# Patient Record
Sex: Male | Born: 1995 | Race: Black or African American | Hispanic: No | Marital: Single
Health system: Southern US, Community
[De-identification: ages and names within clinical notes are randomized; demographics above are authoritative.]

## PROBLEM LIST (undated history)

## (undated) DIAGNOSIS — K649 Unspecified hemorrhoids: Secondary | ICD-10-CM

## (undated) DIAGNOSIS — F419 Anxiety disorder, unspecified: Secondary | ICD-10-CM

## (undated) DIAGNOSIS — F32A Depression, unspecified: Secondary | ICD-10-CM

## (undated) DIAGNOSIS — K602 Anal fissure, unspecified: Secondary | ICD-10-CM

---
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
## 2017-09-11 ENCOUNTER — Emergency Department (HOSPITAL_COMMUNITY)
Admission: EM | Admit: 2017-09-11 | Discharge: 2017-09-11 | Disposition: A | Discharge status: Home / Self Care | Payer: No Typology Code available for payment source | Attending: Emergency Medicine | Admitting: Emergency Medicine

## 2017-09-11 ENCOUNTER — Encounter (HOSPITAL_COMMUNITY): Payer: Self-pay | Admitting: Emergency Medicine

## 2017-09-11 ENCOUNTER — Emergency Department (HOSPITAL_COMMUNITY): Payer: No Typology Code available for payment source

## 2017-09-11 DIAGNOSIS — S90811A Abrasion, right foot, initial encounter: Secondary | ICD-10-CM | POA: Diagnosis not present

## 2017-09-11 DIAGNOSIS — Y92410 Unspecified street and highway as the place of occurrence of the external cause: Secondary | ICD-10-CM | POA: Insufficient documentation

## 2017-09-11 DIAGNOSIS — Y999 Unspecified external cause status: Secondary | ICD-10-CM | POA: Insufficient documentation

## 2017-09-11 DIAGNOSIS — Y9301 Activity, walking, marching and hiking: Secondary | ICD-10-CM | POA: Insufficient documentation

## 2017-09-11 DIAGNOSIS — S8991XA Unspecified injury of right lower leg, initial encounter: Secondary | ICD-10-CM | POA: Diagnosis present

## 2017-09-11 DIAGNOSIS — S9031XA Contusion of right foot, initial encounter: Secondary | ICD-10-CM | POA: Insufficient documentation

## 2017-09-11 DIAGNOSIS — S7001XA Contusion of right hip, initial encounter: Secondary | ICD-10-CM | POA: Insufficient documentation

## 2017-09-11 MED ORDER — BACITRACIN ZINC 500 UNIT/GM EX OINT
TOPICAL_OINTMENT | Freq: Every day | CUTANEOUS | Status: DC
  Administered 2017-09-11: 2 via TOPICAL
  Filled 2017-09-11: qty 1.8

## 2017-09-11 MED ORDER — HYDROCODONE-ACETAMINOPHEN 5-325 MG PO TABS
1.0000 | ORAL_TABLET | Freq: Once | ORAL | Status: AC
  Administered 2017-09-11: 1 via ORAL
  Filled 2017-09-11: qty 1

## 2017-09-11 NOTE — Discharge Instructions (Addendum)
Wear CAM walker and use crutches as needed for comfort. Ice and elevate areas of pain throughout the day, using ice pack for no more than 20 minutes every hour.  Alternate between tylenol and motrin as needed for pain relief.  Keep wounds clean with mild soap and water. Keep area covered with a topical antibiotic ointment and bandage, keep bandage dry. Monitor wounds for signs of infection to include, but not limited to: increasing pain, spreading redness, drainage/pus, worsening swelling, or fevers. Call orthopedic follow up today or tomorrow to schedule followup appointment for recheck of symptoms in 1 week. Return to the ER for changes or worsening symptoms.
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
## 2017-09-12 ENCOUNTER — Encounter (HOSPITAL_COMMUNITY): Payer: Self-pay | Admitting: Emergency Medicine

## 2019-01-18 ENCOUNTER — Other Ambulatory Visit: Payer: Self-pay

## 2019-01-18 DIAGNOSIS — Z20822 Contact with and (suspected) exposure to covid-19: Secondary | ICD-10-CM

## 2019-01-19 LAB — NOVEL CORONAVIRUS, NAA: SARS-CoV-2, NAA: NOT DETECTED

## 2019-08-12 ENCOUNTER — Other Ambulatory Visit: Payer: Self-pay

## 2019-08-12 ENCOUNTER — Ambulatory Visit (HOSPITAL_COMMUNITY)
Admission: EM | Admit: 2019-08-12 | Discharge: 2019-08-12 | Disposition: A | Discharge status: Home / Self Care | Payer: Self-pay

## 2019-08-12 NOTE — ED Notes (Signed)
Per Judeth Cornfield, pt was sent to the Emergency room for further evaluation.  Pt C/O blood in stool and passing out 20 minutes ago. So stephanie thought it would be in the patient best interest to go to the ER.

## 2019-08-24 ENCOUNTER — Emergency Department (HOSPITAL_COMMUNITY): Admission: EM | Admit: 2019-08-24 | Payer: No Typology Code available for payment source | Source: Home / Self Care

## 2019-08-24 ENCOUNTER — Other Ambulatory Visit: Payer: Self-pay

## 2020-11-26 ENCOUNTER — Encounter (HOSPITAL_COMMUNITY): Payer: Self-pay | Admitting: Emergency Medicine

## 2020-11-26 ENCOUNTER — Other Ambulatory Visit: Payer: Self-pay

## 2020-11-26 ENCOUNTER — Emergency Department (HOSPITAL_COMMUNITY): Payer: No Typology Code available for payment source

## 2020-11-26 ENCOUNTER — Emergency Department (HOSPITAL_COMMUNITY)
Admission: EM | Admit: 2020-11-26 | Discharge: 2020-11-26 | Disposition: A | Discharge status: Home / Self Care | Payer: No Typology Code available for payment source | Attending: Emergency Medicine | Admitting: Emergency Medicine

## 2020-11-26 DIAGNOSIS — Y92410 Unspecified street and highway as the place of occurrence of the external cause: Secondary | ICD-10-CM | POA: Insufficient documentation

## 2020-11-26 DIAGNOSIS — Z23 Encounter for immunization: Secondary | ICD-10-CM | POA: Insufficient documentation

## 2020-11-26 DIAGNOSIS — R55 Syncope and collapse: Secondary | ICD-10-CM | POA: Insufficient documentation

## 2020-11-26 DIAGNOSIS — R42 Dizziness and giddiness: Secondary | ICD-10-CM | POA: Diagnosis not present

## 2020-11-26 DIAGNOSIS — H538 Other visual disturbances: Secondary | ICD-10-CM | POA: Insufficient documentation

## 2020-11-26 DIAGNOSIS — R519 Headache, unspecified: Secondary | ICD-10-CM | POA: Diagnosis not present

## 2020-11-26 DIAGNOSIS — S60511A Abrasion of right hand, initial encounter: Secondary | ICD-10-CM | POA: Diagnosis not present

## 2020-11-26 DIAGNOSIS — S6991XA Unspecified injury of right wrist, hand and finger(s), initial encounter: Secondary | ICD-10-CM | POA: Diagnosis present

## 2020-11-26 LAB — COMPREHENSIVE METABOLIC PANEL
ALT: 12 U/L (ref 0–44)
AST: 22 U/L (ref 15–41)
Albumin: 4.4 g/dL (ref 3.5–5.0)
Alkaline Phosphatase: 78 U/L (ref 38–126)
Anion gap: 7 (ref 5–15)
BUN: 12 mg/dL (ref 6–20)
CO2: 28 mmol/L (ref 22–32)
Calcium: 9.4 mg/dL (ref 8.9–10.3)
Chloride: 105 mmol/L (ref 98–111)
Creatinine, Ser: 1.14 mg/dL (ref 0.61–1.24)
GFR, Estimated: >60 mL/min (ref >60)
Glucose, Bld: 94 mg/dL (ref 70–99)
Potassium: 4.4 mmol/L (ref 3.5–5.1)
Sodium: 140 mmol/L (ref 135–145)
Total Bilirubin: 0.6 mg/dL (ref 0.3–1.2)
Total Protein: 8.2 g/dL — ABNORMAL HIGH (ref 6.5–8.1)

## 2020-11-26 LAB — CBC WITH DIFFERENTIAL/PLATELET
Abs Immature Granulocytes: 0.01 10*3/uL (ref 0.00–0.07)
Basophils Absolute: 0 10*3/uL (ref 0.0–0.1)
Basophils Relative: 1 %
Eosinophils Absolute: 0.1 10*3/uL (ref 0.0–0.5)
Eosinophils Relative: 3 %
HCT: 42.2 % (ref 39.0–52.0)
Hemoglobin: 14 g/dL (ref 13.0–17.0)
Immature Granulocytes: 0 %
Lymphocytes Relative: 36 %
Lymphs Abs: 1.5 10*3/uL (ref 0.7–4.0)
MCH: 29.5 pg (ref 26.0–34.0)
MCHC: 33.2 g/dL (ref 30.0–36.0)
MCV: 89 fL (ref 80.0–100.0)
Monocytes Absolute: 0.4 10*3/uL (ref 0.1–1.0)
Monocytes Relative: 10 %
Neutro Abs: 2.1 10*3/uL (ref 1.7–7.7)
Neutrophils Relative %: 50 %
Platelets: 230 10*3/uL (ref 150–400)
RBC: 4.74 MIL/uL (ref 4.22–5.81)
RDW: 12.3 % (ref 11.5–15.5)
WBC: 4.2 10*3/uL (ref 4.0–10.5)
nRBC: 0 % (ref 0.0–0.2)
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
## 2020-11-26 MED ORDER — TETANUS-DIPHTH-ACELL PERTUSSIS 5-2.5-18.5 LF-MCG/0.5 IM SUSY
0.5000 mL | PREFILLED_SYRINGE | Freq: Once | INTRAMUSCULAR | Status: AC
  Administered 2020-11-26: 0.5 mL via INTRAMUSCULAR
  Filled 2020-11-26: qty 0.5

## 2020-11-26 NOTE — Discharge Instructions (Signed)
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  Please continue to monitor your symptoms closely.  I would also recommend following up with your regular doctor.  If you develop any new or worsening symptoms please come back to the emergency department.  It was a pleasure to meet you.
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
## 2020-12-14 ENCOUNTER — Other Ambulatory Visit: Payer: Self-pay

## 2020-12-14 ENCOUNTER — Emergency Department (HOSPITAL_COMMUNITY)
Admission: EM | Admit: 2020-12-14 | Discharge: 2020-12-15 | Disposition: A | Discharge status: Home / Self Care | Payer: Self-pay | Attending: Emergency Medicine | Admitting: Emergency Medicine

## 2020-12-14 ENCOUNTER — Encounter (HOSPITAL_COMMUNITY): Payer: Self-pay | Admitting: Emergency Medicine

## 2020-12-14 DIAGNOSIS — K644 Residual hemorrhoidal skin tags: Secondary | ICD-10-CM | POA: Insufficient documentation

## 2020-12-14 NOTE — ED Triage Notes (Signed)
Pt c/o hemorrhoids x 1 months not relieved by OTC meds.

## 2020-12-15 MED ORDER — HYDROCORTISONE (PERIANAL) 2.5 % EX CREA
TOPICAL_CREAM | Freq: Two times a day (BID) | CUTANEOUS | Status: DC
  Administered 2020-12-15: 1 via RECTAL
  Filled 2020-12-15: qty 28.35

## 2020-12-15 MED ORDER — LIDOCAINE HCL URETHRAL/MUCOSAL 2 % EX GEL
1.0000 "application " | Freq: Once | CUTANEOUS | Status: AC
  Administered 2020-12-15: 1 via TOPICAL
  Filled 2020-12-15: qty 11

## 2020-12-15 NOTE — Discharge Instructions (Addendum)
Apply topical Anusol HC rectal cream twice a day. Continue Miralax and stool softeners. You may benefit from sitz baths 2 times per day as well. Follow up with general surgery in clinic.

## 2020-12-15 NOTE — ED Provider Notes (Signed)
[facility]-EMERGENCY DEPT Provider Note   CSN: [id number] Arrival date & time: 12/14/20  2326     History Chief Complaint  Patient presents with   Hemorrhoids   Rectal Pain    Jim Hall is a 25 y.o. male.  25 year old male presents to the emergency department for evaluation of a hemorrhoid x1 month.  Hemorrhoid has been painful which is noted to be constant, worsening.  No relief with over-the-counter Preparation H cream.  Has also tried stool softeners.  Continues to have bowel movements without issue, though this does increase discomfort.  No associated fevers.  The history is provided by the patient. No language interpreter was used.      History reviewed. No pertinent past medical history.  There are no problems to display for this patient.   History reviewed. No pertinent surgical history.     No family history on file.  Social History   Tobacco Use   Smoking status: Never   Smokeless tobacco: Never  Substance Use Topics   Alcohol use: Never   Drug use: Never    Home Medications Prior to Admission medications   Medication Sig Start Date End Date Taking? Authorizing Provider  ibuprofen (ADVIL,MOTRIN) 200 MG tablet Take 400 mg by mouth every 6 (six) hours as needed for moderate pain.    [provider]  omeprazole (PRILOSEC) 20 MG capsule Take 1 capsule (20 mg total) by mouth daily. 02/18/14   Elpidio Anis, PA-C    Allergies    Patient has no known allergies.  Review of Systems   Review of Systems Ten systems reviewed and are negative for acute change, except as noted in the HPI.    Physical Exam Updated Vital Signs BP 110/72 (BP Location: Left Arm)   Pulse 84   Temp 98 F (36.7 C) (Oral)   Resp 16   SpO2 100%   Physical Exam Vitals and nursing note reviewed. Exam conducted with a chaperone present.  Constitutional:      General: He is not in acute distress.    Appearance: He is well-developed. He is not  diaphoretic.     Comments: Nontoxic appearing  HENT:     Head: Normocephalic and atraumatic.  Eyes:     General: No scleral icterus.    Conjunctiva/sclera: Conjunctivae normal.  Pulmonary:     Effort: Pulmonary effort is normal. No respiratory distress.  Genitourinary:    Rectum: External hemorrhoid present.       Comments: Exam chaperoned by RN. 1 external, nonthrombosed, hemorrhoid noted. Associated TTP. No bleeding. Musculoskeletal:        General: Normal range of motion.     Cervical back: Normal range of motion.  Skin:    General: Skin is warm and dry.     Coloration: Skin is not pale.     Findings: No erythema or rash.  Neurological:     Mental Status: He is alert and oriented to person, place, and time.  Psychiatric:        Behavior: Behavior normal.    ED Results / Procedures / Treatments   Labs (all labs ordered are listed, but only abnormal results are displayed) Labs Reviewed - No data to display  EKG None  Radiology No results found.  Procedures Procedures   Medications Ordered in ED Medications  hydrocortisone (ANUSOL-HC) 2.5 % rectal cream (1 application Rectal Given 12/15/20 0325)  lidocaine (XYLOCAINE) 2 % jelly 1 application (1 application Topical Given 12/15/20 0325)  ED Course  I have reviewed the triage vital signs and the nursing notes.  Pertinent labs & imaging results that were available during my care of the patient were reviewed by me and considered in my medical decision making (see chart for details).    MDM Rules/Calculators/A&P                           25 year old male presenting for an external hemorrhoid.  Lidocaine jelly ordered for pain control in the ED.  Will start on Anusol twice daily.  Advised sitz bath's as well as continuation of stool softeners.  Given referral to general surgery for definitive management.  Return precautions discussed and provided. Patient discharged in stable condition with no unaddressed  concerns.   Final Clinical Impression(s) / ED Diagnoses Final diagnoses:  External hemorrhoid    Rx / DC Orders ED Discharge Orders     None        Antony Madura, PA-C 12/15/20 9528    Nira Conn, MD 12/15/20 2002

## 2020-12-21 ENCOUNTER — Emergency Department (HOSPITAL_COMMUNITY)
Admission: EM | Admit: 2020-12-21 | Discharge: 2020-12-22 | Disposition: A | Discharge status: Home / Self Care | Payer: Self-pay | Attending: Emergency Medicine | Admitting: Emergency Medicine

## 2020-12-21 ENCOUNTER — Encounter (HOSPITAL_COMMUNITY): Payer: Self-pay

## 2020-12-21 ENCOUNTER — Other Ambulatory Visit: Payer: Self-pay

## 2020-12-21 DIAGNOSIS — K644 Residual hemorrhoidal skin tags: Secondary | ICD-10-CM | POA: Insufficient documentation

## 2020-12-21 LAB — COMPREHENSIVE METABOLIC PANEL
ALT: 14 U/L (ref 0–44)
AST: 21 U/L (ref 15–41)
Albumin: 4.2 g/dL (ref 3.5–5.0)
Alkaline Phosphatase: 71 U/L (ref 38–126)
Anion gap: 7 (ref 5–15)
BUN: 10 mg/dL (ref 6–20)
CO2: 29 mmol/L (ref 22–32)
Calcium: 9.6 mg/dL (ref 8.9–10.3)
Chloride: 103 mmol/L (ref 98–111)
Creatinine, Ser: 1.08 mg/dL (ref 0.61–1.24)
GFR, Estimated: >60 mL/min (ref >60)
Glucose, Bld: 124 mg/dL — ABNORMAL HIGH (ref 70–99)
Potassium: 3.9 mmol/L (ref 3.5–5.1)
Sodium: 139 mmol/L (ref 135–145)
Total Bilirubin: 0.5 mg/dL (ref 0.3–1.2)
Total Protein: 8.7 g/dL — ABNORMAL HIGH (ref 6.5–8.1)

## 2020-12-21 LAB — CBC WITH DIFFERENTIAL/PLATELET
Abs Immature Granulocytes: 0 10*3/uL (ref 0.00–0.07)
Basophils Absolute: 0 10*3/uL (ref 0.0–0.1)
Basophils Relative: 1 %
Eosinophils Absolute: 0.2 10*3/uL (ref 0.0–0.5)
Eosinophils Relative: 3 %
HCT: 40.7 % (ref 39.0–52.0)
Hemoglobin: 13.6 g/dL (ref 13.0–17.0)
Immature Granulocytes: 0 %
Lymphocytes Relative: 21 %
Lymphs Abs: 1.3 10*3/uL (ref 0.7–4.0)
MCH: 29.3 pg (ref 26.0–34.0)
MCHC: 33.4 g/dL (ref 30.0–36.0)
MCV: 87.7 fL (ref 80.0–100.0)
Monocytes Absolute: 0.4 10*3/uL (ref 0.1–1.0)
Monocytes Relative: 7 %
Neutro Abs: 4.2 10*3/uL (ref 1.7–7.7)
Neutrophils Relative %: 68 %
Platelets: 327 10*3/uL (ref 150–400)
RBC: 4.64 MIL/uL (ref 4.22–5.81)
RDW: 11.9 % (ref 11.5–15.5)
WBC: 6.3 10*3/uL (ref 4.0–10.5)
nRBC: 0 % (ref 0.0–0.2)

## 2020-12-21 NOTE — ED Provider Notes (Signed)
Emergency Medicine Provider Triage Evaluation Note  Jim Hall , a 25 y.o. male  was evaluated in triage.  Pt complains of rectal pain.  Patient states he was diagnosed with hemorrhoids about 2 months ago.  He was prescribed a lidocaine cream and this has been providing moderate relief of his symptoms but he also recently ran out of this cream.  States that he has been experiencing difficulty with defecation due to pain and has been using suppositories for this.  States that he has been having a small amount of rectal bleeding with bowel movements for the past month.  Today his symptoms became worse including more bleeding and worsening pain.  States that he felt more hemorrhoids "pop out" and then "go back in" when standing back up.  States that while coming to the emergency department he was becoming lightheaded and having near syncopal episodes.  Physical Exam  BP 139/90 (BP Location: Right Arm)   Pulse 79   Resp 16   SpO2 97%  Gen:   Awake, no distress   Resp:  Normal effort  MSK:   Moves extremities without difficulty  Other:    Medical Decision Making  Medically screening exam initiated at 7:34 PM.  Appropriate orders placed.  Jim Hall was informed that the remainder of the evaluation will be completed by another provider, this initial triage assessment does not replace that evaluation, and the importance of remaining in the ED until their evaluation is complete.   Jim Sou, PA-C 12/21/20 1936    Gilda Crease, MD 12/22/20 [phone]

## 2020-12-21 NOTE — ED Triage Notes (Signed)
Pt complains of hemorrhoids x 2 months. Pt states that the bleeding and pain has gotten worse over the last 2 weeks. Pt reports being unable to defecate or pass gas without extreme pain. Pt reports bright red bleeding in stools.

## 2020-12-22 ENCOUNTER — Encounter (HOSPITAL_COMMUNITY): Payer: Self-pay | Admitting: Emergency Medicine

## 2020-12-22 MED ORDER — METOCLOPRAMIDE HCL 5 MG/ML IJ SOLN: 5.0000 mg | Freq: Once | INTRAMUSCULAR | Status: DC

## 2020-12-22 MED ORDER — OXYCODONE-ACETAMINOPHEN 5-325 MG PO TABS: 1.0000 | ORAL_TABLET | ORAL | 0 refills | Status: DC | PRN

## 2020-12-22 MED ORDER — LIDOCAINE HCL URETHRAL/MUCOSAL 2 % EX GEL
1.0000 "application " | Freq: Once | CUTANEOUS | Status: AC
  Administered 2020-12-22: 1 via TOPICAL
  Filled 2020-12-22: qty 11

## 2020-12-22 MED ORDER — HYDROCORTISONE (PERIANAL) 2.5 % EX CREA: 1.0000 "application " | TOPICAL_CREAM | Freq: Two times a day (BID) | CUTANEOUS | 0 refills | Status: DC

## 2020-12-22 MED ORDER — OXYCODONE-ACETAMINOPHEN 5-325 MG PO TABS
2.0000 | ORAL_TABLET | Freq: Once | ORAL | Status: AC
Start: 2020-12-22 — End: 2020-12-22
  Administered 2020-12-22: 2 via ORAL
  Filled 2020-12-22: qty 2

## 2020-12-22 MED ORDER — LIDOCAINE 5 % EX OINT: 1.0000 "application " | TOPICAL_OINTMENT | Freq: Four times a day (QID) | CUTANEOUS | 2 refills | Status: DC | PRN

## 2020-12-22 NOTE — ED Provider Notes (Signed)
[facility]-EMERGENCY DEPT Provider Note   CSN: [id number] Arrival date & time: 12/21/20  1851     History Chief Complaint  Patient presents with   Hemorrhoids    Jim Hall is a 25 y.o. male.  Patient presents to the emergency department for evaluation of rectal pain.  Patient was seen previously for this problem and diagnosed with a hemorrhoid.  He reports that he was using the lidocaine but it ran out.  States that he has been using Preparation H ointment, Preparation H suppositories, stool softeners but symptoms are worsening.  He is experiencing bleeding and severe pain.      History reviewed. No pertinent past medical history.  There are no problems to display for this patient.   History reviewed. No pertinent surgical history.     History reviewed. No pertinent family history.  Social History   Tobacco Use   Smokeless tobacco: Never  Substance Use Topics   Alcohol use: Never   Drug use: Never    Home Medications Prior to Admission medications   Medication Sig Start Date End Date Taking? Authorizing Provider  hydrocortisone (ANUSOL-HC) 2.5 % rectal cream Place 1 application rectally 2 (two) times daily. 12/22/20  Yes Baer Hinton, Canary Brim, MD  lidocaine (XYLOCAINE) 5 % ointment Apply 1 application topically 4 (four) times daily as needed for moderate pain. 12/22/20  Yes Keyasia Jolliff, Canary Brim, MD  oxyCODONE-acetaminophen (PERCOCET) 5-325 MG tablet Take 1-2 tablets by mouth every 4 (four) hours as needed. 12/22/20  Yes Ronia Hazelett, Canary Brim, MD  ibuprofen (ADVIL,MOTRIN) 200 MG tablet Take 400 mg by mouth every 6 (six) hours as needed for moderate pain.    [provider]  omeprazole (PRILOSEC) 20 MG capsule Take 1 capsule (20 mg total) by mouth daily. 02/18/14   Elpidio Anis, PA-C    Allergies    Patient has no known allergies.  Review of Systems   Review of Systems  Gastrointestinal:  Positive for anal bleeding and rectal  pain.  All other systems reviewed and are negative.  Physical Exam Updated Vital Signs BP 121/73 (BP Location: Right Arm)   Pulse (!) 59   Temp 99.7 F (37.6 C) (Oral)   Resp 17   Ht 6\' 1"  (1.854 m)   Wt 74.6 kg   SpO2 100%   BMI 21.69 kg/m   Physical Exam Vitals and nursing note reviewed. Exam conducted with a chaperone present.  Constitutional:      General: He is not in acute distress.    Appearance: Normal appearance. He is well-developed.  HENT:     Head: Normocephalic and atraumatic.     Right Ear: Hearing normal.     Left Ear: Hearing normal.     Nose: Nose normal.  Eyes:     Conjunctiva/sclera: Conjunctivae normal.     Pupils: Pupils are equal, round, and reactive to light.  Cardiovascular:     Rate and Rhythm: Regular rhythm.     Heart sounds: S1 normal and S2 normal. No murmur heard.   No friction rub. No gallop.  Pulmonary:     Effort: Pulmonary effort is normal. No respiratory distress.     Breath sounds: Normal breath sounds.  Chest:     Chest wall: No tenderness.  Abdominal:     General: Bowel sounds are normal.     Palpations: Abdomen is soft.     Tenderness: There is no abdominal tenderness. There is no guarding or rebound. Negative signs include Murphy's  sign and McBurney's sign.     Hernia: No hernia is present.  Genitourinary:    Rectum: External hemorrhoid present.    Musculoskeletal:        General: Normal range of motion.     Cervical back: Normal range of motion and neck supple.  Skin:    General: Skin is warm and dry.     Findings: No rash.  Neurological:     Mental Status: He is alert and oriented to person, place, and time.     GCS: GCS eye subscore is 4. GCS verbal subscore is 5. GCS motor subscore is 6.     Cranial Nerves: No cranial nerve deficit.     Sensory: No sensory deficit.     Coordination: Coordination normal.  Psychiatric:        Speech: Speech normal.        Behavior: Behavior normal.        Thought Content: Thought  content normal.    ED Results / Procedures / Treatments   Labs (all labs ordered are listed, but only abnormal results are displayed) Labs Reviewed  COMPREHENSIVE METABOLIC PANEL - Abnormal; Notable for the following components:      Result Value   Glucose, Bld 124 (*)    Total Protein 8.7 (*)    All other components within normal limits  CBC WITH DIFFERENTIAL/PLATELET    EKG None  Radiology No results found.  Procedures Procedures   Medications Ordered in ED Medications  oxyCODONE-acetaminophen (PERCOCET/ROXICET) 5-325 MG per tablet 2 tablet (2 tablets Oral Given 12/22/20 0620)  lidocaine (XYLOCAINE) 2 % jelly 1 application (1 application Topical Given 12/22/20 2202)    ED Course  I have reviewed the triage vital signs and the nursing notes.  Pertinent labs & imaging results that were available during my care of the patient were reviewed by me and considered in my medical decision making (see chart for details).    MDM Rules/Calculators/A&P                           Examination confirms external hemorrhoid at 6:00.  Hemorrhoid is not thrombosed but it is inflamed, tender and ulcerated with a small amount of bleeding.  Patient will require surgical consultation but does not require emergent evaluation.  Final Clinical Impression(s) / ED Diagnoses Final diagnoses:  External hemorrhoid    Rx / DC Orders ED Discharge Orders          Ordered    lidocaine (XYLOCAINE) 5 % ointment  4 times daily PRN        12/22/20 0632    hydrocortisone (ANUSOL-HC) 2.5 % rectal cream  2 times daily        12/22/20 0632    oxyCODONE-acetaminophen (PERCOCET) 5-325 MG tablet  Every 4 hours PRN        12/22/20 5427             Gilda Crease, MD 12/23/20 0530

## 2020-12-22 NOTE — Discharge Instructions (Addendum)
Continue to use stool softener.  Continue sitz bath's (warm soaks).  Call general surgery for an appointment as soon as possible.

## 2020-12-22 NOTE — ED Notes (Signed)
Pt reports that he has lost more blood per rectum while waiting in the lobby.

## 2021-02-09 ENCOUNTER — Ambulatory Visit: Payer: Self-pay | Admitting: Surgery

## 2021-02-12 ENCOUNTER — Encounter (HOSPITAL_COMMUNITY): Payer: Self-pay

## 2021-02-12 NOTE — Patient Instructions (Addendum)
DUE TO COVID-19 ONLY ONE VISITOR IS ALLOWED TO COME WITH YOU AND STAY IN THE WAITING ROOM ONLY DURING PRE OP AND PROCEDURE.   **NO VISITORS ARE ALLOWED IN THE SHORT STAY AREA OR RECOVERY ROOM!!**         Your procedure is scheduled on: Thursday, 02-19-21   Report to [facility] Main  Entrance    Report to admitting at 7:15 AM   Call this number if you have problems the morning of surgery [phone]   Do not eat food :After Midnight.   May have liquids until 6:30 AM day of surgery  CLEAR LIQUID DIET  Foods Allowed                                                                     Foods Excluded  Water, Black Coffee (no milk/no creamer) and tea, regular and decaf                              liquids that you cannot  Plain Jell-O in any flavor  (No red)                         see through such as: Fruit ices (not with fruit pulp)                                 milk, soups, orange juice  Iced Popsicles (No red)                                    All solid food                             Apple juices Sports drinks like Gatorade (No red) Lightly seasoned clear broth or consume(fat free) Sugar    Complete one Ensure drink the morning of surgery at 6:30 AM the day of surgery.        The day of surgery:  Drink ONE (1) Pre-Surgery Clear Ensure the morning of surgery. Drink in one sitting. Do not sip.  This drink was given to you during your hospital  pre-op appointment visit. Nothing else to drink after completing the Pre-Surgery Clear Ensure.          If you have questions, please contact your surgeon's office.     Oral Hygiene is also important to reduce your risk of infection.                                    Remember - BRUSH YOUR TEETH THE MORNING OF SURGERY WITH YOUR REGULAR TOOTHPASTE   Do NOT smoke after Midnight   Take these medicines the morning of surgery with A SIP OF WATER:  Omeprazole, Oxycodone   Stop all vitamins and herbal supplements a week  before surgery             You may not have any metal  on your body including  jewelry, and body piercing             Do not wear lotions, powders, cologne, or deodorant              Men may shave face and neck.  Do not bring valuables to the hospital.  IS NOT RESPONSIBLE FOR VALUABLES.   Contacts, dentures or bridgework may not be worn into surgery.   Patients discharged the day of surgery will not be allowed to drive home.  Please read over the following fact sheets you were given: IF YOU HAVE QUESTIONS ABOUT YOUR PRE OP INSTRUCTIONS PLEASE CALL [phone] [facility] - Preparing for Surgery Before surgery, you can play an important role.  Because skin is not sterile, your skin needs to be as free of germs as possible.  You can reduce the number of germs on your skin by washing with CHG (chlorahexidine gluconate) soap before surgery.  CHG is an antiseptic cleaner which kills germs and bonds with the skin to continue killing germs even after washing. Please DO NOT use if you have an allergy to CHG or antibacterial soaps.  If your skin becomes reddened/irritated stop using the CHG and inform your nurse when you arrive at Short Stay. Do not shave (including legs and underarms) for at least 48 hours prior to the first CHG shower.  You may shave your face/neck.  Please follow these instructions carefully:  1.  Shower with CHG Soap the night before surgery and the  morning of surgery.  2.  If you choose to wash your hair, wash your hair first as usual with your normal  shampoo.  3.  After you shampoo, rinse your hair and body thoroughly to remove the shampoo.                             4.  Use CHG as you would any other liquid soap.  You can apply chg directly to the skin and wash.  Gently with a scrungie or clean washcloth.  5.  Apply the CHG Soap to your body ONLY FROM THE NECK DOWN.   Do   not use on face/ open                           Wound or open sores. Avoid  contact with eyes, ears mouth and   genitals (private parts).                       Wash face,  Genitals (private parts) with your normal soap.             6.  Wash thoroughly, paying special attention to the area where your    surgery  will be performed.  7.  Thoroughly rinse your body with warm water from the neck down.  8.  DO NOT shower/wash with your normal soap after using and rinsing off the CHG Soap.                9.  Pat yourself dry with a clean towel.            10.  Wear clean pajamas.            11.  Place clean sheets on your bed the night of your first shower and do not  sleep with  pets. Day of Surgery : Do not apply any lotions/deodorants the morning of surgery.  Please wear clean clothes to the hospital/surgery center.  FAILURE TO FOLLOW THESE INSTRUCTIONS MAY RESULT IN THE CANCELLATION OF YOUR SURGERY  PATIENT SIGNATURE_________________________________  NURSE SIGNATURE__________________________________  ________________________________________________________________________

## 2021-02-17 ENCOUNTER — Encounter (HOSPITAL_COMMUNITY): Payer: Self-pay

## 2021-02-17 ENCOUNTER — Encounter (HOSPITAL_COMMUNITY)
Admission: RE | Admit: 2021-02-17 | Discharge: 2021-02-17 | Disposition: A | Discharge status: Home / Self Care | Payer: 59 | Source: Ambulatory Visit | Attending: Surgery | Admitting: Surgery

## 2021-02-17 ENCOUNTER — Other Ambulatory Visit: Payer: Self-pay

## 2021-02-17 DIAGNOSIS — Z01812 Encounter for preprocedural laboratory examination: Secondary | ICD-10-CM | POA: Diagnosis not present

## 2021-02-17 HISTORY — DX: Unspecified hemorrhoids: K64.9

## 2021-02-17 HISTORY — DX: Anxiety disorder, unspecified: F41.9

## 2021-02-17 HISTORY — DX: Anal fissure, unspecified: K60.2

## 2021-02-17 HISTORY — DX: Depression, unspecified: F32.A
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
## 2021-02-19 ENCOUNTER — Encounter (HOSPITAL_COMMUNITY): Payer: Self-pay | Admitting: Surgery

## 2021-02-19 ENCOUNTER — Ambulatory Visit (HOSPITAL_COMMUNITY)
Admission: RE | Admit: 2021-02-19 | Discharge: 2021-02-19 | Disposition: A | Discharge status: Home / Self Care | Payer: 59 | Attending: Surgery | Admitting: Surgery

## 2021-02-19 ENCOUNTER — Encounter (HOSPITAL_COMMUNITY)
Admission: RE | Disposition: A | Discharge status: Home / Self Care | Payer: Self-pay | Source: Home / Self Care | Attending: Surgery

## 2021-02-19 ENCOUNTER — Ambulatory Visit (HOSPITAL_COMMUNITY): Payer: 59 | Admitting: Certified Registered"

## 2021-02-19 DIAGNOSIS — K5090 Crohn's disease, unspecified, without complications: Secondary | ICD-10-CM | POA: Diagnosis not present

## 2021-02-19 DIAGNOSIS — K601 Chronic anal fissure: Secondary | ICD-10-CM | POA: Insufficient documentation

## 2021-02-19 DIAGNOSIS — Z87891 Personal history of nicotine dependence: Secondary | ICD-10-CM | POA: Insufficient documentation

## 2021-02-19 DIAGNOSIS — Z8379 Family history of other diseases of the digestive system: Secondary | ICD-10-CM | POA: Insufficient documentation

## 2021-02-19 DIAGNOSIS — K644 Residual hemorrhoidal skin tags: Secondary | ICD-10-CM | POA: Diagnosis not present

## 2021-02-19 DIAGNOSIS — Z79899 Other long term (current) drug therapy: Secondary | ICD-10-CM | POA: Diagnosis not present

## 2021-02-19 HISTORY — PX: RECTAL EXAM UNDER ANESTHESIA: SHX6399

## 2021-02-19 HISTORY — PX: SPHINCTEROTOMY: SHX5279

## 2021-02-19 HISTORY — PX: HEMORRHOID SURGERY: SHX153

## 2021-02-19 SURGERY — SPHINCTEROTOMY, ANAL
Anesthesia: General | Site: Buttocks

## 2021-02-19 MED ORDER — OXYCODONE HCL 5 MG PO TABS: 5.0000 mg | ORAL_TABLET | Freq: Four times a day (QID) | ORAL | 0 refills | Status: DC | PRN

## 2021-02-19 MED ORDER — KETAMINE HCL 10 MG/ML IJ SOLN
INTRAMUSCULAR | Status: AC
  Filled 2021-02-19: qty 1

## 2021-02-19 MED ORDER — CHLORHEXIDINE GLUCONATE CLOTH 2 % EX PADS: 6.0000 | MEDICATED_PAD | Freq: Once | CUTANEOUS | Status: DC

## 2021-02-19 MED ORDER — 0.9 % SODIUM CHLORIDE (POUR BTL) OPTIME
TOPICAL | Status: DC | PRN
Start: 2021-02-19 — End: 2021-02-19
  Administered 2021-02-19: 1000 mL

## 2021-02-19 MED ORDER — KETAMINE HCL 10 MG/ML IJ SOLN
INTRAMUSCULAR | Status: DC | PRN
  Administered 2021-02-19 (×2): 10 mg via INTRAVENOUS

## 2021-02-19 MED ORDER — GABAPENTIN 300 MG PO CAPS
300.0000 mg | ORAL_CAPSULE | ORAL | Status: AC
  Administered 2021-02-19: 300 mg via ORAL
  Filled 2021-02-19: qty 1

## 2021-02-19 MED ORDER — LIDOCAINE 2% (20 MG/ML) 5 ML SYRINGE
INTRAMUSCULAR | Status: DC | PRN
Start: 2021-02-19 — End: 2021-02-19
  Administered 2021-02-19: 80 mg via INTRAVENOUS

## 2021-02-19 MED ORDER — ACETAMINOPHEN 325 MG PO TABS: 325.0000 mg | ORAL_TABLET | ORAL | Status: DC | PRN

## 2021-02-19 MED ORDER — DIAZEPAM 5 MG PO TABS: 5.0000 mg | ORAL_TABLET | Freq: Three times a day (TID) | ORAL | 1 refills | Status: DC | PRN

## 2021-02-19 MED ORDER — FENTANYL CITRATE (PF) 250 MCG/5ML IJ SOLN
INTRAMUSCULAR | Status: AC
  Filled 2021-02-19: qty 5

## 2021-02-19 MED ORDER — DIBUCAINE (PERIANAL) 1 % EX OINT
TOPICAL_OINTMENT | CUTANEOUS | Status: DC | PRN
  Administered 2021-02-19: 1 via RECTAL

## 2021-02-19 MED ORDER — ONDANSETRON HCL 4 MG/2ML IJ SOLN
INTRAMUSCULAR | Status: DC | PRN
  Administered 2021-02-19: 4 mg via INTRAVENOUS

## 2021-02-19 MED ORDER — BUPIVACAINE-EPINEPHRINE 0.25% -1:200000 IJ SOLN
INTRAMUSCULAR | Status: AC
  Filled 2021-02-19: qty 1

## 2021-02-19 MED ORDER — ORAL CARE MOUTH RINSE: 15.0000 mL | Freq: Once | OROMUCOSAL | Status: AC

## 2021-02-19 MED ORDER — DEXAMETHASONE SODIUM PHOSPHATE 10 MG/ML IJ SOLN
INTRAMUSCULAR | Status: AC
  Filled 2021-02-19: qty 1

## 2021-02-19 MED ORDER — ENSURE PRE-SURGERY PO LIQD
296.0000 mL | Freq: Once | ORAL | Status: DC
Start: 2021-02-19 — End: 2021-02-19
  Filled 2021-02-19: qty 296

## 2021-02-19 MED ORDER — PROPOFOL 10 MG/ML IV BOLUS
INTRAVENOUS | Status: AC
  Filled 2021-02-19: qty 20

## 2021-02-19 MED ORDER — PROPOFOL 10 MG/ML IV BOLUS
INTRAVENOUS | Status: DC | PRN
  Administered 2021-02-19: 150 mg via INTRAVENOUS

## 2021-02-19 MED ORDER — ONDANSETRON HCL 4 MG/2ML IJ SOLN
INTRAMUSCULAR | Status: AC
  Filled 2021-02-19: qty 2

## 2021-02-19 MED ORDER — FENTANYL CITRATE (PF) 100 MCG/2ML IJ SOLN
INTRAMUSCULAR | Status: DC | PRN
  Administered 2021-02-19 (×2): 50 ug via INTRAVENOUS

## 2021-02-19 MED ORDER — SUGAMMADEX SODIUM 200 MG/2ML IV SOLN
INTRAVENOUS | Status: DC | PRN
  Administered 2021-02-19: 200 mg via INTRAVENOUS

## 2021-02-19 MED ORDER — ROCURONIUM BROMIDE 100 MG/10ML IV SOLN
INTRAVENOUS | Status: DC | PRN
  Administered 2021-02-19: 50 mg via INTRAVENOUS

## 2021-02-19 MED ORDER — LIDOCAINE 2% (20 MG/ML) 5 ML SYRINGE
INTRAMUSCULAR | Status: DC | PRN
  Administered 2021-02-19: 1.5 mg/kg/h via INTRAVENOUS

## 2021-02-19 MED ORDER — DEXMEDETOMIDINE (PRECEDEX) IN NS 20 MCG/5ML (4 MCG/ML) IV SYRINGE
PREFILLED_SYRINGE | INTRAVENOUS | Status: DC | PRN
  Administered 2021-02-19: 4 ug via INTRAVENOUS

## 2021-02-19 MED ORDER — OXYCODONE HCL 5 MG PO TABS: 5.0000 mg | ORAL_TABLET | Freq: Once | ORAL | Status: DC | PRN

## 2021-02-19 MED ORDER — BUPIVACAINE LIPOSOME 1.3 % IJ SUSP
INTRAMUSCULAR | Status: AC
  Filled 2021-02-19: qty 20

## 2021-02-19 MED ORDER — MEPERIDINE HCL 50 MG/ML IJ SOLN: 6.2500 mg | INTRAMUSCULAR | Status: DC | PRN

## 2021-02-19 MED ORDER — ACETAMINOPHEN 500 MG PO TABS
1000.0000 mg | ORAL_TABLET | ORAL | Status: AC
  Administered 2021-02-19: 1000 mg via ORAL
  Filled 2021-02-19: qty 2

## 2021-02-19 MED ORDER — BUPIVACAINE LIPOSOME 1.3 % IJ SUSP
INTRAMUSCULAR | Status: DC | PRN
  Administered 2021-02-19: 20 mL

## 2021-02-19 MED ORDER — ONDANSETRON HCL 4 MG/2ML IJ SOLN: 4.0000 mg | Freq: Once | INTRAMUSCULAR | Status: DC | PRN

## 2021-02-19 MED ORDER — CELECOXIB 200 MG PO CAPS
200.0000 mg | ORAL_CAPSULE | ORAL | Status: AC
  Administered 2021-02-19: 200 mg via ORAL
  Filled 2021-02-19: qty 1

## 2021-02-19 MED ORDER — BUPIVACAINE LIPOSOME 1.3 % IJ SUSP: 20.0000 mL | Freq: Once | INTRAMUSCULAR | Status: DC

## 2021-02-19 MED ORDER — SODIUM CHLORIDE 0.9 % IV SOLN
2.0000 g | INTRAVENOUS | Status: AC
  Administered 2021-02-19: 2 g via INTRAVENOUS
  Filled 2021-02-19: qty 20

## 2021-02-19 MED ORDER — ROCURONIUM BROMIDE 10 MG/ML (PF) SYRINGE
PREFILLED_SYRINGE | INTRAVENOUS | Status: AC
  Filled 2021-02-19: qty 10

## 2021-02-19 MED ORDER — LACTATED RINGERS IV SOLN: INTRAVENOUS | Status: DC

## 2021-02-19 MED ORDER — DEXAMETHASONE SODIUM PHOSPHATE 10 MG/ML IJ SOLN
INTRAMUSCULAR | Status: DC | PRN
  Administered 2021-02-19: 8 mg via INTRAVENOUS

## 2021-02-19 MED ORDER — ACETAMINOPHEN 160 MG/5ML PO SOLN: 325.0000 mg | ORAL | Status: DC | PRN

## 2021-02-19 MED ORDER — OXYCODONE HCL 5 MG/5ML PO SOLN: 5.0000 mg | Freq: Once | ORAL | Status: DC | PRN

## 2021-02-19 MED ORDER — BUPIVACAINE-EPINEPHRINE 0.25% -1:200000 IJ SOLN
INTRAMUSCULAR | Status: DC | PRN
  Administered 2021-02-19: 20 mL

## 2021-02-19 MED ORDER — CHLORHEXIDINE GLUCONATE 0.12 % MT SOLN
15.0000 mL | Freq: Once | OROMUCOSAL | Status: AC
  Administered 2021-02-19: 15 mL via OROMUCOSAL

## 2021-02-19 MED ORDER — MIDAZOLAM HCL 5 MG/5ML IJ SOLN
INTRAMUSCULAR | Status: DC | PRN
  Administered 2021-02-19: 2 mg via INTRAVENOUS

## 2021-02-19 MED ORDER — DIBUCAINE (PERIANAL) 1 % EX OINT
TOPICAL_OINTMENT | CUTANEOUS | Status: AC
  Filled 2021-02-19: qty 28

## 2021-02-19 MED ORDER — MIDAZOLAM HCL 2 MG/2ML IJ SOLN
INTRAMUSCULAR | Status: AC
  Filled 2021-02-19: qty 2

## 2021-02-19 MED ORDER — LIDOCAINE HCL (PF) 2 % IJ SOLN
INTRAMUSCULAR | Status: AC
  Filled 2021-02-19: qty 5

## 2021-02-19 MED ORDER — FENTANYL CITRATE PF 50 MCG/ML IJ SOSY: 25.0000 ug | PREFILLED_SYRINGE | INTRAMUSCULAR | Status: DC | PRN

## 2021-02-19 SURGICAL SUPPLY — 42 items
APL SKNCLS STERI-STRIP NONHPOA (GAUZE/BANDAGES/DRESSINGS) ×1
BAG COUNTER SPONGE SURGICOUNT (BAG) IMPLANT
BAG SPNG CNTER NS LX DISP (BAG)
BENZOIN TINCTURE PRP APPL 2/3 (GAUZE/BANDAGES/DRESSINGS) ×2 IMPLANT
BLADE SURG 15 STRL LF DISP TIS (BLADE) ×1 IMPLANT
BLADE SURG 15 STRL SS (BLADE) ×2
CNTNR URN SCR LID CUP LEK RST (MISCELLANEOUS) ×1 IMPLANT
CONT SPEC 4OZ STRL OR WHT (MISCELLANEOUS) ×2
COVER SURGICAL LIGHT HANDLE (MISCELLANEOUS) ×2 IMPLANT
DECANTER SPIKE VIAL GLASS SM (MISCELLANEOUS) ×2 IMPLANT
DRAPE LAPAROTOMY T 102X78X121 (DRAPES) ×2 IMPLANT
DRSG PAD ABDOMINAL 8X10 ST (GAUZE/BANDAGES/DRESSINGS) ×2 IMPLANT
ELECT REM PT RETURN 15FT ADLT (MISCELLANEOUS) ×2 IMPLANT
GAUZE 4X4 16PLY ~~LOC~~+RFID DBL (SPONGE) ×2 IMPLANT
GAUZE SPONGE 4X4 12PLY STRL (GAUZE/BANDAGES/DRESSINGS) ×2 IMPLANT
GLOVE SURG NEOPR MICRO LF SZ8 (GLOVE) ×2 IMPLANT
GLOVE SURG UNDER LTX SZ8 (GLOVE) ×2 IMPLANT
GOWN STRL REUS W/TWL XL LVL3 (GOWN DISPOSABLE) ×4 IMPLANT
KIT BASIN OR (CUSTOM PROCEDURE TRAY) ×2 IMPLANT
KIT TURNOVER KIT A (KITS) ×2 IMPLANT
LOOP VESSEL MAXI BLUE (MISCELLANEOUS) IMPLANT
NEEDLE HYPO 22GX1.5 SAFETY (NEEDLE) ×2 IMPLANT
PACK BASIC VI WITH GOWN DISP (CUSTOM PROCEDURE TRAY) ×2 IMPLANT
PANTS MESH DISP LRG (UNDERPADS AND DIAPERS) ×1 IMPLANT
PANTS MESH DISPOSABLE L (UNDERPADS AND DIAPERS) ×1
PENCIL SMOKE EVACUATOR (MISCELLANEOUS) IMPLANT
SHEARS HARMONIC 9CM CVD (BLADE) IMPLANT
SUCTION FRAZIER HANDLE 12FR (TUBING)
SUCTION TUBE FRAZIER 12FR DISP (TUBING) IMPLANT
SURGILUBE 2OZ TUBE FLIPTOP (MISCELLANEOUS) ×2 IMPLANT
SUT CHROMIC 2 0 SH (SUTURE) ×2 IMPLANT
SUT CHROMIC 3 0 SH 27 (SUTURE) IMPLANT
SUT VIC AB 2-0 SH 27 (SUTURE)
SUT VIC AB 2-0 SH 27X BRD (SUTURE) IMPLANT
SUT VIC AB 2-0 UR6 27 (SUTURE) ×8 IMPLANT
SWAB COLLECTION DEVICE MRSA (MISCELLANEOUS) IMPLANT
SWAB CULTURE ESWAB REG 1ML (MISCELLANEOUS) IMPLANT
SYR 20ML LL LF (SYRINGE) ×2 IMPLANT
SYR 3ML LL SCALE MARK (SYRINGE) IMPLANT
SYR BULB IRRIG 60ML STRL (SYRINGE) IMPLANT
TOWEL OR 17X26 10 PK STRL BLUE (TOWEL DISPOSABLE) ×2 IMPLANT
TOWEL OR NON WOVEN STRL DISP B (DISPOSABLE) ×2 IMPLANT

## 2021-02-19 NOTE — Transfer of Care (Signed)
Immediate Anesthesia Transfer of Care Note  Patient: Jim Hall  Procedure(s) Performed: ANAL SPHINCTEROTOMY (Buttocks) HEMORRHOIDECTOMY (Buttocks) RECTAL EXAM UNDER ANESTHESIA (Buttocks)  Patient Location: PACU  Anesthesia Type:General  Level of Consciousness: drowsy and patient cooperative  Airway & Oxygen Therapy: Patient Spontanous Breathing and Patient connected to face mask oxygen  Post-op Assessment: Report given to RN and Post -op Vital signs reviewed and stable  Post vital signs: Reviewed and stable  Last Vitals:  Vitals Value Taken Time  BP    Temp    Pulse 65 02/19/21 1107  Resp 21 02/19/21 1107  SpO2 100 % 02/19/21 1107  Vitals shown include unvalidated device data.  Last Pain:  Vitals:   02/19/21 0800  TempSrc: Oral         Complications: No notable events documented.

## 2021-02-19 NOTE — Anesthesia Postprocedure Evaluation (Signed)
Anesthesia Post Note  Patient: Jim Hall  Procedure(s) Performed: ANAL SPHINCTEROTOMY (Buttocks) HEMORRHOIDECTOMY (Buttocks) RECTAL EXAM UNDER ANESTHESIA (Buttocks)     Patient location during evaluation: PACU Anesthesia Type: General Level of consciousness: awake and alert Pain management: pain level controlled Vital Signs Assessment: post-procedure vital signs reviewed and stable Respiratory status: spontaneous breathing, nonlabored ventilation, respiratory function stable and patient connected to nasal cannula oxygen Cardiovascular status: blood pressure returned to baseline and stable Postop Assessment: no apparent nausea or vomiting Anesthetic complications: no   No notable events documented.  Last Vitals:  Vitals:   02/19/21 1215 02/19/21 1230  BP: (!) 99/59 128/80  Pulse:  (!) 53  Resp: 14 16  Temp:  36.6 C  SpO2: 98% 98%    Last Pain:  Vitals:   02/19/21 1230  TempSrc:   PainSc: 0-No pain                 Laveyah Oriol
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
## 2021-02-19 NOTE — Discharge Instructions (Signed)
##############################################  ANORECTAL SURGERY:  POST OPERATIVE INSTRUCTIONS  ######################################################################  EAT Start with a pureed / full liquid diet After 24 hours, gradually transition to a high fiber diet.    CONTROL PAIN Control pain so you can tolerate bowel movements,  walk, sleep, tolerate sneezing/coughing, and go up/down stairs.   HAVE A BOWEL MOVEMENT DAILY Keep your bowels regular to avoid problems.   Taking a fiber supplement every day to keep bowels soft.   Try a laxative to override constipation. Use an antidairrheal to slow down diarrhea.   Call if not better after 2 tries  WALK Walk an hour a day.  Control your pain to do that.   CALL IF YOU HAVE PROBLEMS/CONCERNS Call if you are still struggling despite following these instructions. Call if you have concerns not answered by these instructions  ######################################################################    Take your usually prescribed home medications unless otherwise directed.  DIET: Follow a light bland diet & liquids the first 24 hours after arrival home, such as soup, liquids, starches, etc.  Be sure to drink plenty of fluids.  Quickly advance to a usual solid diet within a few days.  Avoid fast food or heavy meals as your are more likely to get nauseated or have irregular bowels.  A low-fat, high-fiber diet for the rest of your life is ideal.  PAIN CONTROL: Pain is best controlled by a usual combination of many methods TOGETHER: Warm baths/soaks or Ice packs Over the counter pain medication Prescription pain medications Topical creams  Expect swelling and discomfort in the anus/rectal area.  Warm water baths (30-60 minutes up to 6 times a day, especially after bowel meovements) will help. Use ice for the first few days to help decrease swelling and bruising, then switch to heat such as warm towels, sitz baths, warm baths, etc to  help relax tight/sore spots and speed recovery.  Some people prefer to use ice alone, heat alone, alternating between ice & heat.  Experiment to what works for you.   It is helpful to take an over-the-counter pain medication continuously for the first few weeks.  Choose one of the following that works best for you: Naproxen (Aleve, etc)  Two 220mg tabs twice a day Ibuprofen (Advil, etc) Three 200mg tabs four times a day (every meal & bedtime) Acetaminophen (Tylenol, etc) 500-650mg four times a day (every meal & bedtime) A  prescription for pain medication (such as oxycodone, hydrocodone, etc) should be given to you upon discharge.  Take your pain medication as prescribed.  If you are having problems/concerns with the prescription medicine (does not control pain, nausea, vomiting, rash, itching, etc), please call us [phone] to see if we need to switch you to a different pain medicine that will work better for you and/or control your side effect better. If you need a refill on your pain medication, please contact your pharmacy.  They will contact our office to request authorization. Prescriptions will not be filled after 5 pm or on week-ends.  If can take up to 48 hours for it to be filled & ready so avoid waiting until you are down to thel ast pill. A topical cream (Dibucaine) or a prescription for a cream (such as diltiazem 2% gel) may be given to you.  Many people find relief with topical creams.  Some people find it burns too much.  Experiment.  If it helps, use it.  If it burns, don't using it. You also may receive a prescription for   diazepam, and a muscle relaxant to help you to be able to urinate and defecate more easily.  It is safe to take a few doses with the other medications as long as you are not planning to drive or do anything intense.  Hopefully this can minimize the chance of needing a Foley catheter into your bladder   Use a Sitz Bath 4-8 times a day for relief   Sitz Bath A  sitz bath is a warm water bath taken in the sitting position that covers only the hips and buttocks. It may be used for either healing or hygiene purposes. Sitz baths are also used to relieve pain, itching, or muscle spasms. The water may contain medicine. Moist heat will help you heal and relax.  HOME CARE INSTRUCTIONS  Take 3 to 4 sitz baths a day. Fill the bathtub half full with warm water. Sit in the water and open the drain a little. Turn on the warm water to keep the tub half full. Keep the water running constantly. Soak in the water for 15 to 20 minutes. After the sitz bath, pat the affected area dry first.   KEEP YOUR BOWELS REGULAR The goal is one soft bowel movement a day Avoid getting constipated.  Between the surgery and the pain medications, it is common to experience some constipation.  Increasing fluid intake and taking a fiber supplement (such as Metamucil, Citrucel, FiberCon, MiraLax, etc) 2-3 times a day regularly will usually help prevent this problem from occurring.  A mild laxative (prune juice, Milk of Magnesia, MiraLax, etc) should be taken according to package directions if there are no bowel movements after 48 hours. Watch out for diarrhea.  If you have many loose bowel movements, simplify your diet to bland foods & liquids for a few days.  Stop any stool softeners and decrease your fiber supplement.  Switching to mild anti-diarrheal medications (Kayopectate, Pepto Bismol) can help.  Can try an imodium/loperamide dose.  If this worsens or does not improve, please call us.  Wound Care  Remove your bandages with your first bowel movement, usually the day after surgery.  Let the gauze fall off with the first bowel movement or shower.   Wear an absorbent pad or soft cotton balls in your underwear as needed to catch any drainage and help keep the area  Keep the area clean and dry.  Bathe / shower every day.  Keep the area clean by showering / bathing over the incision / wound.    It is okay to soak an open wound to help wash it.  Consider using a squeeze bottle filled with warm water to gently wash the anal area.  Wet wipes or showers / gentle washing after bowel movements is often less traumatic than regular toilet paper. You will often notice bleeding with bowel movements.  This should slow down by the end of the first week of surgery.  Sitting on an ice pack can help. Expect some drainage.  This should slow down by the end of the first week of surgery, but you will have occasional bleeding or drainage up to a few months after surgery.  Wear an absorbent pad or soft cotton gauze in your underwear until the drainage stops.  ACTIVITIES as tolerated:   You may resume regular (light) daily activities beginning the next day--such as daily self-care, walking, climbing stairs--gradually increasing activities as tolerated.  If you can walk 30 minutes without difficulty, it is safe to try more intense activity   such as jogging, treadmill, bicycling, low-impact aerobics, swimming, etc. Save the most intensive and strenuous activity for last such as sit-ups, heavy lifting, contact sports, etc  Refrain from any heavy lifting or straining until you are off narcotics for pain control.   DO NOT PUSH THROUGH PAIN.  Let pain be your guide: If it hurts to do something, don't do it.  Pain is your body warning you to avoid that activity for another week until the pain goes down. You may drive when you are no longer taking prescription pain medication, you can comfortably sit for long periods of time, and you can safely maneuver your car and apply brakes. You may have sexual intercourse when it is comfortable.  FOLLOW UP in our office Please call [facility] at [phone] to set up an appointment to see your surgeon in the office for a follow-up appointment approximately 2-3 weeks after your surgery. Make sure that you call for this appointment the day you arrive home to ensure a convenient appointment  time.  8. IF YOU HAVE DISABILITY OR FAMILY LEAVE FORMS, BRING THEM TO THE OFFICE FOR PROCESSING.  DO NOT GIVE THEM TO YOUR DOCTOR.        WHEN TO CALL us [phone]: Poor pain control Reactions / problems with new medications (rash/itching, nausea, etc)  Fever over 101.5 F (38.5 C) Inability to urinate Nausea and/or vomiting Worsening swelling or bruising Continued bleeding from incision. Increased pain, redness, or drainage from the incision  The clinic staff is available to answer your questions during regular business hours (8:30am-5pm).  Please don't hesitate to call and ask to speak to one of our nurses for clinical concerns.   A surgeon from [facility] is always on call at the hospitals   If you have a medical emergency, go to the nearest emergency room or call 911.    [facility] [address], [address], [place], [place]  [address] ? MAIN: [phone]) [phone] ? TOLL FREE: [phone] ? FAX [phone] [email or web address]  #####################################################   WHAT IS AN ANAL FISSURE? An anal fissure (fissure-in-ano) is a small, oval shaped tear in skin that lines the opening of the anus. Fissures typically cause severe pain and bleeding with bowel movements. Fissures are quite common in the general population, but are often confused with other causes of pain and bleeding, such as hemorrhoids.  WHAT ARE THE SYMPTOMS OF AN ANAL FISSURE? The typical symptoms of an anal fissure include severe pain during, and especially after, a bowel movement, lasting from several minutes to a few hours. Patients may also notice bright red blood from the anus that can be seen on the toilet paper or on the stool. Between bowel movements, patients with anal fissures are often relatively symptom-free. Many patients are fearful of having a bowel movement and may try to avoid defecation secondary to the pain.   WHAT CAUSES  AN ANAL FISSURE? Fissures are usually caused by trauma to the inner lining of the anus. Patients with tight anal sphincter muscles (i.e., increased muscle tone) are more prone to developing anal fissures. A hard, dry bowel movement is typically responsible, but loose stools and diarrhea can also be the cause. Following a bowel movement, severe anal pain can produce spasm of the anal sphincter muscle, resulting in a decrease in blood flow to the site of the injury, thus impairing healing of the wound. The next bowel movement results in more pain, anal spasm, decreased blood flow  to the area, and the cycle continues. Treatments are aimed at interrupting this cycle by relaxing the anal sphincter muscle to promote healing of the fissure.   Other, less common, causes include inflammatory conditions and certain anal infections or tumors. Anal fissures may be acute (recent onset) or chronic (present for a long period of time). Chronic fissures may be more difficult to treat, and may also have an external lump associated with the tear, called a sentinel pile or skin tag, as well as extra tissue just inside the anal canal (hypertrophied papilla) .  WHAT IS THE TREATMENT OF ANAL FISSURES? The majority of anal fissures do not require surgery. The most common treatment for an acute anal fissure consists of making the stool more formed and bulky with a diet high in fiber and utilization of over-the-counter fiber supplementation (totaling 25-35 grams of fiber/day). Stool softeners and increasing water intake may be necessary to promote soft bowel movements and aid in the healing process. Topical anesthetics for pain and warm tub baths (sitz baths) for 10-20 minutes several times a day (especially after bowel movements) are soothing and promote relaxation of the anal muscles, which may help the healing process.  Other medications (such as diltiazem) may be prescribed that allow relaxation of the anal sphincter muscles. Your  surgeon will go over benefits and side-effects of each of these with you. Narcotic pain medications are not recommended for anal fissures, as they promote constipation. Chronic fissures are generally more difficult to treat, and your surgeon may advise surgical treatment.  WILL THE PROBLEM RETURN? Fissures can recur easily, and it is quite common for a fully healed fissure to recur after a hard bowel movement or other trauma. Even when the pain and bleeding have subsided, it is very important to continue good bowel habits and a diet high in fiber as a lifestyle change. If the problem returns without an obvious cause, further assessment is warranted.  GETTING TO GOOD BOWEL HEALTH. Irregular bowel habits such as constipation and diarrhea can lead to many problems over time.  Having one soft bowel movement a day is the most important way to prevent further problems.  The anorectal canal is designed to handle stretching and feces to safely manage our ability to get rid of solid waste (feces, poop, stool) out of our body.  BUT, hard constipated stools can act like ripping concrete bricks and diarrhea can be a burning fire to this very sensitive area of our body, causing inflamed hemorrhoids, anal fissures, increasing risk is perirectal abscesses, abdominal pain/bloating, an making irritable bowel worse.     The goal: ONE SOFT BOWEL MOVEMENT A DAY!  To have soft, regular bowel movements:  Drink at least 8 tall glasses of water a day.   Take plenty of fiber.  Fiber is the undigested part of plant food that passes into the colon, acting s "natures broom" to encourage bowel motility and movement.  Fiber can absorb and hold large amounts of water. This results in a larger, bulkier stool, which is soft and easier to pass. Work gradually over several weeks up to 6 servings a day of fiber (25g a day even more if needed) in the form of: Vegetables -- Root (potatoes, carrots, turnips), leafy green (lettuce, salad greens,  celery, spinach), or cooked high residue (cabbage, broccoli, etc) Fruit -- Fresh (unpeeled skin & pulp), Dried (prunes, apricots, cherries, etc ),  or stewed ( applesauce)  Whole grain breads, pasta, etc (whole wheat)  Bran cereals  Bulking Agents -- This type of water-retaining fiber generally is easily obtained each day by one of the following:  Psyllium bran -- The psyllium plant is remarkable because its ground seeds can retain so much water. This product is available as Metamucil, Konsyl, Effersyllium, Per Diem Fiber, or the less expensive generic preparation in drug and health food stores. Although labeled a laxative, it really is not a laxative.  Methylcellulose -- This is another fiber derived from wood which also retains water. It is available as Citrucel. Polyethylene Glycol - and "artificial" fiber commonly called Miralax or Glycolax.  It is helpful for people with gassy or bloated feelings with regular fiber Flax Seed - a less gassy fiber than psyllium No reading or other relaxing activity while on the toilet. If bowel movements take longer than 5 minutes, you are too constipated AVOID CONSTIPATION.  High fiber and water intake usually takes care of this.  Sometimes a laxative is needed to stimulate more frequent bowel movements, but  Laxatives are not a good long-term solution as it can wear the colon out. Osmotics (Milk of Magnesia, Fleets phosphosoda, Magnesium citrate, MiraLax, GoLytely) are safer than  Stimulants (Senokot, Castor Oil, Dulcolax, Ex Lax)    Do not take laxatives for more than 7days in a row.  IF SEVERELY CONSTIPATED, try a Bowel Retraining Program: Do not use laxatives.  Eat a diet high in roughage, such as bran cereals and leafy vegetables.  Drink six (6) ounces of prune or apricot juice each morning.  Eat two (2) large servings of stewed fruit each day.  Take one (1) heaping tablespoon of a psyllium-based bulking agent twice a day. Use sugar-free sweetener when  possible to avoid excessive calories.  Eat a normal breakfast.  Set aside 15 minutes after breakfast to sit on the toilet, but do not strain to have a bowel movement.  If you do not have a bowel movement by the third day, use an enema and repeat the above steps.  Controlling diarrhea Switch to liquids and simpler foods for a few days to avoid stressing your intestines further. Avoid dairy products (especially milk & ice cream) for a short time.  The intestines often can lose the ability to digest lactose when stressed. Avoid foods that cause gassiness or bloating.  Typical foods include beans and other legumes, cabbage, broccoli, and dairy foods.  Every person has some sensitivity to other foods, so listen to our body and avoid those foods that trigger problems for you. Adding fiber (Citrucel, Metamucil, psyllium, Miralax) gradually can help thicken stools by absorbing excess fluid and retrain the intestines to act more normally.  Slowly increase the dose over a few weeks.  Too much fiber too soon can backfire and cause cramping & bloating. Probiotics (such as active yogurt, Align, etc) may help repopulate the intestines and colon with normal bacteria and calm down a sensitive digestive tract.  Most studies show it to be of mild help, though, and such products can be costly. Medicines: Bismuth subsalicylate (ex. Kayopectate, Pepto Bismol) every 30 minutes for up to 6 doses can help control diarrhea.  Avoid if pregnant. Loperamide (Immodium) can slow down diarrhea.  Start with two tablets (4mg  total) first and then try one tablet every 6 hours.  Avoid if you are having fevers or severe pain.  If you are not better or start feeling worse, stop all medicines and call your doctor for advice Call your doctor if you are getting worse or not better.  Sometimes further testing (cultures, endoscopy, X-ray studies, bloodwork, etc) may be needed to help diagnose and treat the cause of the diarrhea.   WHAT CAN  BE DONE IF THE FISSURE DOES NOT HEAL? A fissure that fails to respond to conservative measures should be re-examined. Persistent hard or loose bowel movements, scarring, or spasm of the internal anal muscle all contribute to delayed healing. Other medical problems such as inflammatory bowel disease (Crohn's disease), infections, or anal tumors can cause symptoms similar to anal fissures. Patients suffering from persistent anal pain should be examined to exclude these symptoms. This may include a colonoscopy or an exam in the operating room under anesthesia.  WHAT DOES SURGERY INVOLVE? Surgical options for treating anal fissure include Botulinum toxin (Botox) injection into the anal sphincter and surgical division of a portion of the internal anal sphincter (lateral internal sphincterotomy). Both of these are performed typically as outpatient, same-day procedures, or occasionally in the office setting. The goal of these surgical options is to promote relaxation of the anal sphincter, thereby decreasing anal pain and spasm, allowing the fissure to heal. Botox injection results in healing in 50-80% of patients, while sphincterotomy is reported to be over 90% successful. If a sentinel pile is present, it may be removed to promote healing of the fissure. All surgical procedures carry some risk, and a sphincterotomy can rarely interfere with one's ability to control gas and stool. Your colon and rectal surgeon will discuss these risks with you to determine the appropriate treatment for your particular situation.  HOW LONG IS THE RECOVERY AFTER SURGERY? It is important to note that complete healing with both medical and surgical treatments can take up to approximately 6-10 weeks. However, acute pain after surgery often disappears after a few days. Most patients will be able to return to work and resume daily activities in a few short days after the surgery.  CAN FISSURES LEAD TO COLON CANCER? Absolutely not.  Persistent symptoms, however, need careful evaluation since other conditions other than an anal fissure can cause similar symptoms. Your colon and rectal surgeon may request additional tests, even if your fissure has successfully healed. A colonoscopy may be required to exclude other causes of rectal bleeding.

## 2021-02-19 NOTE — Op Note (Signed)
02/19/2021  10:52 AM  PATIENT:  Jim Hall  25 y.o. male  Patient Care Team: Pcp, No as PCP - General Patient, No Pcp Per (Inactive) (General Practice) Pyrtle, Carie Caddy, MD as Consulting Physician (Gastroenterology) Karie Soda, MD as Consulting Physician (General Surgery) Gilda Crease, MD as Attending Physician (Emergency Medicine)  PRE-OPERATIVE DIAGNOSIS:  ANAL FISSURE REFRACTORY TO MEDICAL MANAGEMENT  POST-OPERATIVE DIAGNOSIS:  ANAL FISSURE REFRACTORY TO MEDICAL MANAGEMENT  PROCEDURE:  Procedure(s): ANAL SPHINCTEROTOMY HEMORRHOIDECTOMY RECTAL EXAM UNDER ANESTHESIA  SURGEON:  Ardeth Sportsman, MD  ASSISTANT: OR Staff   ANESTHESIA:   General Anorectal & Local field block (0.25% bupivacaine with epinephrine mixed with Liposomal bupivacaine (Experel)   EBL:  Total I/O In: 100 [IV Piggyback:100] Out: 5 [Blood:5]  Delay start of Pharmacological VTE agent (>24hrs) due to surgical blood loss or risk of bleeding:  no  DRAINS: none   SPECIMEN: Anterior anal canal fissure with sentinel tag  DISPOSITION OF SPECIMEN:  PATHOLOGY  COUNTS:  YES  PLAN OF CARE: Discharge to home after PACU  PATIENT DISPOSITION:  PACU - hemodynamically stable.  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  History of Crohn's disease controlled with medication with no prior history of fistulous disease or other anorectal problems.  I recommended examination and surgical treatment:  The anatomy & physiology of the anorectal region was discussed.  The pathophysiology of anal fissure and differential diagnosis was discussed.  Natural history progression  was discussed.   I stressed the importance of a bowel regimen to have daily soft bowel movements to minimize progression of disease.     The patient's condition is not adequately controlled.  Non-operative treatment has not healed the fissure.  Therefore, I recommended  examination under anesthesia for better examination to confirm the diagnosis and treat by lateral internal sphincterotomy to relax the spasm better & allow the fissure to heal.  Technique, benefits, alternatives were discussed.   I noted a good likelihood this will help address the problem.  Risks such as bleeding, pain, incontinence, recurrence, heart attack, death, and other risks were discussed.    Educational handouts further explaining the pathology, treatment options, and bowel regimen were given as well.  The patient expressed understanding & wishes to proceed with surgery.  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  Hypopigmented anterior midline sentinel tag going up into midline raphae.  Excised with open wound.    Sphincterotomy location:  Left lateral anal canal.  60% distal internal sphincterotomy performed  No evidence of any fistula.  Grade 1 internal hemorrhoids = no ligation/pexy performed   IDESCRIPTION:   Informed consent was confirmed. Patient underwent general anesthesia without difficulty. Patient was placed into prone jackknife positioning.  The perianal region was prepped and draped in sterile fashion. Surgical timeout confirmed or plan.  I did digital rectal examination and then transitioned over to anoscopy to get a sense of the anatomy.  I identified an anal fissure in the anterior midline anal canal.  Had a hypopigmented somewhat fibrotic chronic sentinel tag going up into the midline raphae.  No evidence of any abscess or fistula the sphincter tone was increased.  No stricture.  No abscess located.  No fistulous disease.  Very mild proctitis.  I went ahead and proceeded with internal sphinterotmy technique.  Placed a 2-0 Vicryl on a UR 6 and did ligation of the left lateral hemorrhoidal column.  Ran suture down closer to the hemorrhoidal pile.  I transected  through the anoderm of the left lateral anal canal  longitudinally.  I identified the internal and external sphincters.  Elevating the internal sphincter, I proceeded with a partial internal sphincterotomy starting distally and moving proximally using cautery.  This involved the distal 60% thickness.  This provided improved relaxation of the anal sphincter.  I closed the anal canal wound vertically by continuing the 2-0 Vicryl suture good result, leaving a 5 mm distal opening to allow drainage.  I then focused on the anterior midline fissure.  I excised the sentinel tag going up the midline raphae and left an open 25 x 25 mm open anterior midline anal canal and perirectal wound.  Decided to leave it open and let it granulate by secondary intention to avoid any stricturing, especially light of his Crohn's disease.  Again saw no evidence of any other pathology such as fistula disease nor any concerning hemorrhoids.  Given his history of Crohn's disease with underwhelming hemorrhoids, held off on any ligation and pexy and kept it simple.    I repeated anoscopy and examination.  There is was no narrowing.  Hemostasis was excellent.  Hemostasis was good.  Patient is being extubated go to recovery room.  I discussed operative findings, updated the patient's status, discussed probable steps to recovery, and gave postoperative recommendations to the patient's significant other, Jim Hall.  Recommendations were made.  Questions were answered.  She expressed understanding & appreciation.Ardeth Sportsman, M.D., F.A.C.S. Gastrointestinal and Minimally Invasive Surgery [facility] [address], [address] [place], [place] [address] [phone] Main / Paging

## 2021-02-19 NOTE — Anesthesia Preprocedure Evaluation (Addendum)
Anesthesia Evaluation  Patient identified by MRN, date of birth, ID band Patient awake    Reviewed: Allergy & Precautions, H&P , NPO status , Patient's Chart, lab work & pertinent test results, reviewed documented beta blocker date and time   Airway Mallampati: II  TM Distance: >3 FB Neck ROM: full    Dental no notable dental hx. (+) Teeth Intact, Poor Dentition, Chipped,    Pulmonary neg pulmonary ROS, former smoker,    Pulmonary exam normal breath sounds clear to auscultation       Cardiovascular Exercise Tolerance: Good negative cardio ROS   Rhythm:regular Rate:Normal     Neuro/Psych PSYCHIATRIC DISORDERS Anxiety Depression negative neurological ROS     GI/Hepatic negative GI ROS, Neg liver ROS,   Endo/Other  negative endocrine ROS  Renal/GU negative Renal ROS  negative genitourinary   Musculoskeletal   Abdominal   Peds  Hematology negative hematology ROS (+)   Anesthesia Other Findings   Reproductive/Obstetrics negative OB ROS                            Anesthesia Physical Anesthesia Plan  ASA: 2  Anesthesia Plan: General   Post-op Pain Management:    Induction: Intravenous  PONV Risk Score and Plan: 2 and Ondansetron and Dexamethasone  Airway Management Planned: Oral ETT  Additional Equipment: None  Intra-op Plan:   Post-operative Plan: Extubation in OR  Informed Consent: I have reviewed the patients History and Physical, chart, labs and discussed the procedure including the risks, benefits and alternatives for the proposed anesthesia with the patient or authorized representative who has indicated his/her understanding and acceptance.     Dental Advisory Given  Plan Discussed with: CRNA and Anesthesiologist  Anesthesia Plan Comments:         Anesthesia Quick Evaluation

## 2021-02-19 NOTE — Anesthesia Procedure Notes (Signed)
Procedure Name: Intubation Date/Time: 02/19/2021 9:57 AM Performed by: Marny Lowenstein, CRNA Pre-anesthesia Checklist: Patient identified, Emergency Drugs available, Suction available and Patient being monitored Patient Re-evaluated:Patient Re-evaluated prior to induction Oxygen Delivery Method: Circle system utilized Preoxygenation: Pre-oxygenation with 100% oxygen Induction Type: IV induction Ventilation: Mask ventilation without difficulty Laryngoscope Size: Miller and 3 Grade View: Grade I Tube type: Oral Tube size: 8.0 mm Number of attempts: 1 Airway Equipment and Method: Stylet Placement Confirmation: ETT inserted through vocal cords under direct vision, positive ETCO2 and breath sounds checked- equal and bilateral Secured at: 24 cm Tube secured with: Tape Dental Injury: Teeth and Oropharynx as per pre-operative assessment

## 2021-02-19 NOTE — H&P (Signed)
02/19/2021     REFERRING PHYSICIAN: Unknown  Patient Care Team: None as PCP - General Pyrtle, Burnard Leigh, MD (Gastroenterology) Hira Trent, Shawn Route, MD as Consulting Provider (Colon and Rectal Surgery)  PROVIDER: Jarrett Soho, MD  [facility] MRN: [id number] DOB: [birth year] DATE OF ENCOUNTER: 01/19/2021  Subjective   Chief Complaint: Hemorrhoids   History of Present Illness: Jim Hall is a 25 y.o. male who is seen today as an office consultation at the request of Dr. Blinda Leatherwood ED MD for evaluation of Hemorrhoids .   Patient comes today with me BDM for the first time ever. He notes at least 2 months of severe anal pain. Debilitating. Feeling bedridden. Lost his job. Sharp pain with bowel movements. Noticed some blood as well. He has been try to use a stool softener. That usually helps him have a larger 1 in the morning. He had worsening pain. Went to the emergency room. Concern for hemorrhoid. I recommended sitz bath's and wipes. Over-the-counter medications. He recalls getting prescription for lidocaine which initially burns but sometimes soothes. However not better. His mother was diagnosed with Crohn's disease. Looks like she is treated at [facility] gastroenterology. He does not know of any history of Crohn's disease.  He does not smoke. No diabetes. No sleep apnea. No cardiac or pulmonary issues. No prior history of hemorrhoids or abscesses. No prior abdominal surgery. Frustrated and disappointed.  Medical History: No past medical history on file.  Patient Active Problem List  Diagnosis   Family history of Crohn's disease   External hemorrhoids   Chronic anal fissure   No past surgical history on file.   No Known Allergies  No current outpatient medications on file prior to visit.   No current facility-administered medications on file prior to visit.   No family history on file.   Social History   Tobacco Use  Smoking Status Never Smoker  Smokeless Tobacco  Never Used    Social History   Socioeconomic History   Marital status: Radiation protection practitioner  Tobacco Use   Smoking status: Never Smoker   Smokeless tobacco: Never Used  Building services engineer Use: Never used  Substance and Sexual Activity   Alcohol use: Never   Drug use: Never   ############################################################  Review of Systems: A complete review of systems (ROS) was obtained from the patient. I have reviewed this information and discussed as appropriate with the patient. See HPI as well for other pertinent ROS.  Constitutional: No fevers, chills, sweats. Weight stable Eyes: No vision changes, No discharge HENT: No sore throats, nasal drainage Lymph: No neck swelling, No bruising easily Pulmonary: No cough, productive sputum CV: No orthopnea, PND Patient walks 30 minutes for about 2 miles without difficulty. No exertional chest/neck/shoulder/arm pain.  GI: Mother diagnosed with Crohn's disease. Followed by Dr. Rhea Belton with [facility] gastroenterology.   No personal nor family history of GI/colon cancer, irritable bowel syndrome, allergy such as Celiac Sprue, dietary/dairy problems, colitis, ulcers nor gastritis. No recent sick contacts/gastroenteritis. No travel outside the country. No changes in diet.  Renal: No UTIs, No hematuria Genital: No drainage, bleeding, masses Musculoskeletal: No severe joint pain. Good ROM major joints Skin: No sores or lesions Heme/Lymph: No easy bleeding. No swollen lymph nodes  Objective:   Vitals:  01/19/21 0927  BP: 132/74  Pulse: 99  Temp: 36.3 C (97.3 F)  SpO2: 98%  Weight: 77.9 kg (171 lb 12.8 oz)  Height: 167.6 cm (5\' 6" )    Body mass index is  27.73 kg/m.  PHYSICAL EXAM:  Constitutional: Not cachectic. Hygeine adequate. Vitals signs as above.  Eyes: Pupils reactive, normal extraocular movements. Sclera nonicteric Neuro: CN II-XII intact. No major focal sensory defects. No major motor deficits. Lymph: No  head/neck/groin lymphadenopathy Psych: No severe agitation. No severe anxiety. Judgment & insight Adequate, Oriented x4, HENT: Normocephalic, Mucus membranes moist. No thrush.  Neck: Supple, No tracheal deviation. No obvious thyromegaly Chest: No pain to chest wall compression. Good respiratory excursion. No audible wheezing CV: Pulses intact. Regular rhythm. No major extremity edema  Abdomen: Flat Hernia: Not present. Diastasis recti: Not present. Soft. Nondistended. Nontender. No hepatomegaly. No splenomegaly  Gen: Inguinal hernia: Not present. Inguinal lymph nodes: without lymphadenopathy.   Rectal: ##################################  Patient examined in decubitus position. Perianal skin Clean with good hygiene  Pruritis ani: Not present   Anal fissure: Present at: Anterior midline with hypopigmented sentinel external hemorrhoidal tag   Perirectal abscess/fistula Not present Pilonidal disease: Not present Condyloma / warts: Not present  Digital and anoscopic rectal exam Not tolerated  Sphincter tone Increased   ###################################  Ext: No obvious deformity or contracture. Edema: Not present. No cyanosis Skin: No major subcutaneous nodules. Warm and dry Musculoskeletal: Severe joint rigidity not present. No obvious clubbing. No digital petechiae.   Labs, Imaging and Diagnostic Testing:  Located in 'Care Everywhere' section of Epic EMR chart  PRIOR NOTES   Not applicable  SURGERY NOTES:  Not applicable  PATHOLOGY:  Not applicable  Assessment and Plan:  DIAGNOSES:  Diagnoses and all orders for this visit:  Chronic anal fissure  External hemorrhoids  Family history of Crohn's disease  Other orders - gabapentin (NEURONTIN) 100 MG capsule; Take 1 capsule (100 mg total) by mouth 3 (three) times daily Can increase to 300mg  2 times a day    ASSESSMENT/PLAN  Patient with classic story for anal fissure with sentinel tag. Confirmed on  physical exam. However anterior midline atypical and raises concern, especially in light with his mother having been diagnosed with Crohn's disease.  There is no evidence of active fistulous Crohn's disease but that would be a concern  No improvement with diltiazem cream 4 times daily for 6 weeks.   Therefore surgery offerred:  The anatomy & physiology of the anorectal region was discussed.  The pathophysiology of anal fissure and differential diagnosis was discussed.  Natural history progression  was discussed.   I stressed the importance of a bowel regimen to have daily soft bowel movements to minimize progression of disease.     The patient's condition is not adequately controlled.  Non-operative treatment has not healed the fissure.  Therefore, I recommended examination under anesthesia for better examination to confirm the diagnosis and treat by lateral internal sphincterotomy to relax the spasm better & allow the fissure to heal.  Technique, benefits, alternatives were discussed.    .  I noted a good likelihood this will help address the problem.  Risks such as bleeding, pain, incontinence, injury to other organs, need for repair of tissues / organs, recurrence, heart attack, death, and other risks were discussed.    Educational handouts further explaining the pathology, treatment options, and bowel regimen were given as well.  The patient expressed understanding & wishes to proceed with surgery.      ########################################################     , MD, FACS, MASCRS Esophageal, Gastrointestinal & Colorectal Surgery Robotic and Minimally Invasive Surgery  [facility], [facility]  [facility]  [address]  [address] [place], [place] [address] [phone] Fax [phone] Main  CONTACT INFORMATION:  Weekday (9AM-5PM): Call [facility] main office at [phone]  Weeknight (5PM-9AM) or Weekend/Holiday: Check  [email or web address] (password " [email or web address]") for General Surgery [facility] coverage  (Please, do not use SecureChat as it is not reliable communication to operating surgeons for immediate patient care)

## 2021-02-20 ENCOUNTER — Encounter (HOSPITAL_COMMUNITY): Payer: Self-pay | Admitting: Surgery

## 2021-02-23 LAB — SURGICAL PATHOLOGY

## 2021-05-16 ENCOUNTER — Ambulatory Visit (HOSPITAL_COMMUNITY)
Admission: RE | Admit: 2021-05-16 | Discharge: 2021-05-16 | Disposition: A | Discharge status: Home / Self Care | Payer: Self-pay | Attending: Psychiatry | Admitting: Psychiatry

## 2021-05-16 ENCOUNTER — Inpatient Hospital Stay (HOSPITAL_COMMUNITY)
Admission: AD | Admit: 2021-05-16 | Payer: Federal, State, Local not specified - Other | Source: Home / Self Care | Admitting: Psychiatry

## 2021-05-16 DIAGNOSIS — R45851 Suicidal ideations: Secondary | ICD-10-CM | POA: Insufficient documentation

## 2021-05-16 DIAGNOSIS — F32A Depression, unspecified: Secondary | ICD-10-CM | POA: Insufficient documentation

## 2021-05-16 DIAGNOSIS — Z62810 Personal history of physical and sexual abuse in childhood: Secondary | ICD-10-CM | POA: Insufficient documentation

## 2021-05-16 DIAGNOSIS — U071 COVID-19: Secondary | ICD-10-CM | POA: Insufficient documentation

## 2021-05-16 DIAGNOSIS — F909 Attention-deficit hyperactivity disorder, unspecified type: Secondary | ICD-10-CM | POA: Insufficient documentation

## 2021-05-16 LAB — RESP PANEL BY RT-PCR (FLU A&B, COVID) ARPGX2
Influenza A by PCR: NEGATIVE
Influenza B by PCR: NEGATIVE
SARS Coronavirus 2 by RT PCR: POSITIVE — AB
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
## 2021-05-17 ENCOUNTER — Encounter (HOSPITAL_COMMUNITY): Payer: Self-pay

## 2021-05-17 ENCOUNTER — Other Ambulatory Visit: Payer: Self-pay

## 2021-05-17 ENCOUNTER — Emergency Department (HOSPITAL_COMMUNITY)
Admission: EM | Admit: 2021-05-17 | Discharge: 2021-05-19 | Disposition: A | Discharge status: Home / Self Care | Payer: Self-pay | Attending: Emergency Medicine | Admitting: Emergency Medicine

## 2021-05-17 DIAGNOSIS — R45851 Suicidal ideations: Secondary | ICD-10-CM | POA: Insufficient documentation

## 2021-05-17 DIAGNOSIS — U071 COVID-19: Secondary | ICD-10-CM | POA: Insufficient documentation

## 2021-05-17 LAB — RAPID URINE DRUG SCREEN, HOSP PERFORMED
Amphetamines: NOT DETECTED
Barbiturates: NOT DETECTED
Benzodiazepines: NOT DETECTED
Cocaine: NOT DETECTED
Opiates: NOT DETECTED
Tetrahydrocannabinol: POSITIVE — AB

## 2021-05-17 LAB — CBC WITH DIFFERENTIAL/PLATELET
Abs Immature Granulocytes: 0.01 10*3/uL (ref 0.00–0.07)
Basophils Absolute: 0.1 10*3/uL (ref 0.0–0.1)
Basophils Relative: 1 %
Eosinophils Absolute: 0.2 10*3/uL (ref 0.0–0.5)
Eosinophils Relative: 4 %
HCT: 41.2 % (ref 39.0–52.0)
Hemoglobin: 13.6 g/dL (ref 13.0–17.0)
Immature Granulocytes: 0 %
Lymphocytes Relative: 47 %
Lymphs Abs: 2.5 10*3/uL (ref 0.7–4.0)
MCH: 29.1 pg (ref 26.0–34.0)
MCHC: 33 g/dL (ref 30.0–36.0)
MCV: 88.2 fL (ref 80.0–100.0)
Monocytes Absolute: 0.4 10*3/uL (ref 0.1–1.0)
Monocytes Relative: 8 %
Neutro Abs: 2.1 10*3/uL (ref 1.7–7.7)
Neutrophils Relative %: 40 %
Platelets: 250 10*3/uL (ref 150–400)
RBC: 4.67 MIL/uL (ref 4.22–5.81)
RDW: 13 % (ref 11.5–15.5)
WBC: 5.3 10*3/uL (ref 4.0–10.5)
nRBC: 0 % (ref 0.0–0.2)

## 2021-05-17 LAB — SALICYLATE LEVEL: Salicylate Lvl: <7 mg/dL — ABNORMAL LOW (ref 7.0–30.0)

## 2021-05-17 LAB — COMPREHENSIVE METABOLIC PANEL
ALT: 13 U/L (ref 0–44)
AST: 18 U/L (ref 15–41)
Albumin: 4.1 g/dL (ref 3.5–5.0)
Alkaline Phosphatase: 64 U/L (ref 38–126)
Anion gap: 8 (ref 5–15)
BUN: 8 mg/dL (ref 6–20)
CO2: 27 mmol/L (ref 22–32)
Calcium: 8.9 mg/dL (ref 8.9–10.3)
Chloride: 101 mmol/L (ref 98–111)
Creatinine, Ser: 1.2 mg/dL (ref 0.61–1.24)
GFR, Estimated: >60 mL/min (ref >60)
Glucose, Bld: 103 mg/dL — ABNORMAL HIGH (ref 70–99)
Potassium: 3.5 mmol/L (ref 3.5–5.1)
Sodium: 136 mmol/L (ref 135–145)
Total Bilirubin: 0.8 mg/dL (ref 0.3–1.2)
Total Protein: 7.6 g/dL (ref 6.5–8.1)

## 2021-05-17 LAB — ACETAMINOPHEN LEVEL: Acetaminophen (Tylenol), Serum: <10 ug/mL — ABNORMAL LOW (ref 10–30)

## 2021-05-17 LAB — ETHANOL: Alcohol, Ethyl (B): <10 mg/dL (ref <10)

## 2021-05-17 MED ORDER — SERTRALINE HCL 50 MG PO TABS
50.0000 mg | ORAL_TABLET | Freq: Every day | ORAL | Status: DC
  Administered 2021-05-18 – 2021-05-19 (×2): 50 mg via ORAL
  Filled 2021-05-17 (×2): qty 1

## 2021-05-17 MED ORDER — LORAZEPAM 1 MG PO TABS
1.0000 mg | ORAL_TABLET | ORAL | Status: DC | PRN
  Administered 2021-05-17 – 2021-05-19 (×5): 1 mg via ORAL
  Filled 2021-05-17 (×5): qty 1

## 2021-05-17 MED ORDER — LORAZEPAM 1 MG PO TABS
1.0000 mg | ORAL_TABLET | Freq: Once | ORAL | Status: AC
Start: 2021-05-17 — End: 2021-05-17
  Administered 2021-05-17: 1 mg via ORAL
  Filled 2021-05-17: qty 1

## 2021-05-17 MED ORDER — NICOTINE 21 MG/24HR TD PT24
21.0000 mg | MEDICATED_PATCH | Freq: Once | TRANSDERMAL | Status: AC
  Administered 2021-05-17: 21 mg via TRANSDERMAL
  Filled 2021-05-17: qty 1

## 2021-05-17 MED ORDER — HALOPERIDOL 1 MG PO TABS
2.0000 mg | ORAL_TABLET | Freq: Once | ORAL | Status: AC
  Administered 2021-05-17: 2 mg via ORAL
  Filled 2021-05-17: qty 2

## 2021-05-17 NOTE — ED Notes (Signed)
Patient is stating he wants to leave and wants his belongings. Pt has been told to stay in his room and close the door because he is covid positive.

## 2021-05-17 NOTE — ED Notes (Addendum)
Backcharting due to timing.   1345 - I was notified that pt was using the computer in the room to watch Youtube. I entered the room and advised pt this was not permissible. He became argumentative about [place] pt policies. It was also noted that pt had suction tubing under his gown and draped behind his neck with a Yankaur suction device connected and chewing on it. Pt was asked to remove this and he declined. Pt stated "I'm not giving it to you because you're white". Security called to bedside.   Scott by Paramedic in ED to completely remove all equipment from room including: computer, otoscope light setup, bed alarm. All cabinets with a lock were secured. Pt again began tampering with the Sunquest device and removing labels.   1400 - Dr. Matilde Sprang called to bedside to evaluate pt. Pt able to be de-escalated and agreed to PO medications. Pt continues to be voluntary status at this time.   1425 - Pt allowed to use phone for second phone call of the day and became agitated when attempting to remove phone. Pt non-compliant with interventions. Pt noted to have cotton tip swabs broken off and chewing on another plastic piece. Pt de-escalated and agreeable to taking PO medications.

## 2021-05-17 NOTE — ED Notes (Signed)
Pt's belongings removed and placed into pt belongings bag, labeled with pt's name Contents: 1 pair black shoes, 1 black shirt, 1 pair black sweatpants, 1 jacket, 1 cellphone

## 2021-05-17 NOTE — Progress Notes (Addendum)
Patient presented to [facility] with complaint of worsening depression and suicidal ideation. Patient was recommended for inpatient psychiatric treatment and was offered inpatient psych bed at [place] however patient is Covid positive which is an exclusionary criteria for admission to [facility]. Patient will be transferred to [facility] with follow-up by psychiatry. Patient continues to meet recommendation for inpatient psychiatric treatment. Report called to EDP Dr. Ralene Bathe at [facility]. Patient is currently asymptomatic with stable vital signs. BP 125/71 (BP Location: Right Arm)    Pulse 84    Temp 98.2 F (36.8 C) (Oral)    Resp 14    SpO2 100%   Addendum 01:30 EMS presented to [facility] to transport patient to [facility]. Patient says is he willing to go to [facility] but refuses to be transported by EMS due concerns for incurring ambulance transportation charges. Patient says he will drive himself to [facility]; Writer explained to patient that he would be IVC'd if he attempts to evade staffs/writer or does not go to ED due to suicidal concerns. Writer drove behind patient to assure he gets to [facility]. Patient was handed over to [place], Therapist, sports. Patient remains voluntarily at this time.

## 2021-05-17 NOTE — ED Notes (Signed)
Pt's belongings placed in "Patient Belongings 23-25" cabinet

## 2021-05-17 NOTE — ED Notes (Signed)
Pt is asleep. Will provide breakfast when awake.
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
## 2021-05-17 NOTE — ED Triage Notes (Signed)
Pt came from [facility] [place], Covid +, Pt reports having burning with urination.

## 2021-05-17 NOTE — ED Notes (Signed)
Pt wanded by security. 

## 2021-05-17 NOTE — ED Notes (Signed)
Pt changed into burgundy scrubs and hospital socks 

## 2021-05-17 NOTE — ED Notes (Signed)
Monitor cords and phone removed from pt's room

## 2021-05-17 NOTE — ED Notes (Signed)
Pt sought out nurse to discuss his plan of care. Pt updated on status, and he became anxious about pending court cases he needs to appear at later this week. Pt offered and accepted oral ativan for anxiety, see MAR. When pt taking medication it was noted that he had two metallic appearing spiked piercings in his lower lip. I offered to remove from pt and place along with other valuables, but he declined vehemently, stating "the holes will close up within an hour and I won't take them out unless y'all have something to replace them with". Consulting civil engineer notified.

## 2021-05-17 NOTE — ED Provider Notes (Signed)
[facility]-EMERGENCY DEPT Provider Note   CSN: [id number] Arrival date & time: 05/17/21  0156     History  Chief Complaint  Patient presents with   Suicidal   Covid Positive    Jim Hall is a 26 y.o. male.  The history is provided by the patient and medical records.  Jim Hall is a 26 y.o. male who presents to the Emergency Department complaining of suicidal thoughts.  Patient presents to the emergency department upon referral from behavioral health for evaluation of suicidal thoughts.  He has a longstanding history of SI that has recently worsened.  No clear triggers.  He states that he has not slept for 4 days and has been staying in his car for the last 4 days.  He does not have an active plan.  He does state that at times he gets angry and hostile to friends and family and he does not want to be this way.  He has no known medical problems and takes no medications.  He presented voluntarily for evaluation at behavioral health and was transferred to the emergency department due to positive COVID-19 test.  He currently has no COVID-19 complaints.      Home Medications Prior to Admission medications   Not on File      Allergies    Patient has no known allergies.    Review of Systems   Review of Systems  All other systems reviewed and are negative.  Physical Exam Updated Vital Signs BP 116/73 (BP Location: Left Arm)    Pulse (!) 58    Temp 97.9 F (36.6 C) (Oral)    Resp 16    SpO2 97%  Physical Exam Vitals and nursing note reviewed.  Constitutional:      Appearance: He is well-developed.  HENT:     Head: Normocephalic and atraumatic.  Cardiovascular:     Rate and Rhythm: Normal rate and regular rhythm.  Pulmonary:     Effort: Pulmonary effort is normal. No respiratory distress.  Musculoskeletal:        General: No tenderness.  Skin:    General: Skin is warm and dry.  Neurological:     Mental Status: He is alert and oriented to person,  place, and time.  Psychiatric:        Behavior: Behavior normal.    ED Results / Procedures / Treatments   Labs (all labs ordered are listed, but only abnormal results are displayed) Labs Reviewed  COMPREHENSIVE METABOLIC PANEL - Abnormal; Notable for the following components:      Result Value   Glucose, Bld 103 (*)    All other components within normal limits  ACETAMINOPHEN LEVEL - Abnormal; Notable for the following components:   Acetaminophen (Tylenol), Serum <10 (*)    All other components within normal limits  SALICYLATE LEVEL - Abnormal; Notable for the following components:   Salicylate Lvl <7.0 (*)    All other components within normal limits  CBC WITH DIFFERENTIAL/PLATELET  ETHANOL  RAPID URINE DRUG SCREEN, HOSP PERFORMED  GC/CHLAMYDIA PROBE AMP ([place]) NOT AT [facility]    EKG None  Radiology No results found.  Procedures Procedures    Medications Ordered in ED Medications - No data to display  ED Course/ Medical Decision Making/ A&P Clinical Course as of 05/17/21 0734  Wynelle Link May 17, 2021  0715 Talk to him if he tries to leave [MK]    Clinical Course User Index [MK] Kommor, Wyn Forster, MD  Medical Decision Making Patient here voluntarily for evaluation of suicidal ideation.  He has no active plan.  He does report depressive symptoms.  He did incidentally test positive for COVID-19 but is asymptomatic.  He has been medically cleared for psychiatric evaluation and treatment.         Final Clinical Impression(s) / ED Diagnoses Final diagnoses:  Suicidal ideation    Rx / DC Orders ED Discharge Orders     None         Tilden Fossa, MD 05/17/21 [phone]

## 2021-05-17 NOTE — ED Notes (Signed)
Breakfast provided.

## 2021-05-17 NOTE — ED Notes (Signed)
Lunch tray provided. 

## 2021-05-18 MED ORDER — RISPERIDONE 0.5 MG PO TABS
0.2500 mg | ORAL_TABLET | Freq: Two times a day (BID) | ORAL | Status: DC
  Administered 2021-05-18 – 2021-05-19 (×3): 0.25 mg via ORAL
  Filled 2021-05-18 (×3): qty 1

## 2021-05-18 MED ORDER — NICOTINE 14 MG/24HR TD PT24
14.0000 mg | MEDICATED_PATCH | Freq: Every day | TRANSDERMAL | Status: DC
  Administered 2021-05-18 – 2021-05-19 (×2): 14 mg via TRANSDERMAL
  Filled 2021-05-18 (×2): qty 1

## 2021-05-18 NOTE — Consult Note (Signed)
°  Patient seen and reassessed by TTS. Patient continues to meet inpatient criteria depsite his denial for suicidal ideations.  Patient with very high risk for suicide completion to include 2 previous suicide attempts of high lethality by hanging, previous psychiatric diagnoses, recent loss and or separation, mood lability, age, and substance abuse.  At present patient is currently taking sertraline 50 mg p.o. daily, we have added risperidone 0.25 mg p.o. twice daily due to his current agitation, aggression, and acute psychosis.  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  As patient recently had a disagreement with his significant other, made a social media post regarding ending his life, and contacted the suicide hotline.  -We have made appropriate recommendations to include medication to target his current symptoms.  We will continue to recommend inpatient, although patient recently tested positive for COVID-19.  Which will likely make it very difficult to get patient placed in inpatient unit.  Will continue to treat, and hopefully stabilize while in the emergency room, create a safety plan to make patient stay safe and others.  However at this time current recommendations will remain the same. -Psychiatry will continue to follow patient daily and adjust medications as needed.

## 2021-05-18 NOTE — Progress Notes (Signed)
[facility] LCSW Progress Note   05/18/2021    11:15 AM  Jim Hall   [id number]   Type of Contact and Topic: Psychiatric Bed Placement   Patient meets inpatient criteria per Cecilio Asper, NP. Patient referred to the following facilities:  Destination Service Provider Request Status Address Phone Fax  [facility]  Pending - Request Sent [address] [place] [address] [phone]  [facility]  Pending - Request Sent [address] [place] [address] [phone]  [facility]  Pending - Request Sent [address] [place] [address] [phone]  [facility]  Pending - Request Sent [address] [address] [phone]  [facility]  Pending - Request Sent [address] [place] [address] [phone]  [facility]  Pending - Request Sent [address] [place] [address] [phone]  [facility]  Pending - Request Sent [address] [place] [address] [phone]    Patient tested positive for COVID on 05/16/2021, unlikely to be placed for psychiatric treatment until quarantine time has passed or subsequent test result as negative. CSW will continue to monitor disposition.   Signed:  Corky Crafts, MSW, LCSWA, LCASA 05/18/2021 11:15 AM

## 2021-05-18 NOTE — ED Notes (Signed)
Patient received dinner tray 

## 2021-05-18 NOTE — ED Notes (Signed)
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

## 2021-05-18 NOTE — ED Provider Notes (Signed)
Emergency Medicine Observation Re-evaluation Note  Jim Hall is a 26 y.o. male, seen on rounds today.  Pt initially presented to the ED for complaints of Suicidal and Covid Positive Currently, the patient is sitting in room.  Physical Exam  BP 108/65 (BP Location: Right Arm)    Pulse 66    Temp 98.3 F (36.8 C) (Oral)    Resp 17    SpO2 95%  Physical Exam General: resting comfortably, NAD Lungs: normal WOB Psych: currently calm and resting  ED Course / MDM  EKG:   I have reviewed the labs performed to date as well as medications administered while in observation.  Recent changes in the last 24 hours include none.  Plan  Current plan is for TTS evaluation today and recommendation.  Jim Hall is not under involuntary commitment.     Jim Hall, [place] 05/18/21 [phone]

## 2021-05-18 NOTE — ED Notes (Addendum)
Patient had his phone call for this evening

## 2021-05-19 NOTE — ED Provider Notes (Signed)
Emergency Medicine Observation Re-evaluation Note  Jim Hall is a 26 y.o. male, seen on rounds today.  Pt initially presented to the ED for complaints of Suicidal and Covid Positive Currently, the patient is resting in bed watching TV.   Physical Exam  BP 137/73 (BP Location: Right Arm)    Pulse 81    Temp 97.9 F (36.6 C) (Oral)    Resp 18    SpO2 97%  Physical Exam Psychiatric:        Attention and Perception: Attention and perception normal.        Mood and Affect: Mood normal.        Speech: Speech normal.        Behavior: Behavior is cooperative.        Thought Content: Thought content does not include suicidal ideation. Thought content does not include homicidal or suicidal plan.   General: awake, alert and resting in bed.  Cardiac: no murmur auscultated Lungs: slightly diminished without obvious wheezing Psych: appropiate  ED Course / MDM  EKG:   I have reviewed the labs performed to date as well as medications administered while in observation.  Recent changes in the last 24 hours include N/A.  Plan  Current plan is for inpatient psychiatric. Although he reports after having some conversations he does not feel suicidal anymore. Yet, he is high risk with prior attempts.   Levander Campion is not under involuntary commitment.     Claude Manges, PA-C 05/19/21 [id number]    Jacalyn Lefevre, MD 05/19/21 1056

## 2021-05-19 NOTE — ED Notes (Signed)
Patient received news that their grandmother passed away. Patient upset and wants to leave.

## 2021-05-19 NOTE — ED Notes (Signed)
Staff and safety sitter aware of order for urine sample, patient has been made aware and provided with a urinal.

## 2021-05-19 NOTE — ED Notes (Signed)
Patient leaving AMA. Patient educated on risks, and provided information about resources.

## 2021-05-20 LAB — GC/CHLAMYDIA PROBE AMP (~~LOC~~) NOT AT ARMC
Chlamydia: NEGATIVE
Comment: NEGATIVE
Comment: NORMAL
Neisseria Gonorrhea: NEGATIVE

## 2021-10-02 ENCOUNTER — Encounter (HOSPITAL_BASED_OUTPATIENT_CLINIC_OR_DEPARTMENT_OTHER): Payer: Self-pay

## 2021-10-02 ENCOUNTER — Emergency Department (HOSPITAL_BASED_OUTPATIENT_CLINIC_OR_DEPARTMENT_OTHER)
Admission: EM | Admit: 2021-10-02 | Discharge: 2021-10-03 | Disposition: A | Discharge status: Home / Self Care | Payer: Self-pay | Attending: Emergency Medicine | Admitting: Emergency Medicine

## 2021-10-02 ENCOUNTER — Other Ambulatory Visit: Payer: Self-pay

## 2021-10-02 DIAGNOSIS — Y92410 Unspecified street and highway as the place of occurrence of the external cause: Secondary | ICD-10-CM | POA: Insufficient documentation

## 2021-10-02 DIAGNOSIS — S60222A Contusion of left hand, initial encounter: Secondary | ICD-10-CM

## 2021-10-02 DIAGNOSIS — S0990XA Unspecified injury of head, initial encounter: Secondary | ICD-10-CM

## 2021-10-02 DIAGNOSIS — M542 Cervicalgia: Secondary | ICD-10-CM | POA: Insufficient documentation

## 2021-10-02 DIAGNOSIS — S6992XA Unspecified injury of left wrist, hand and finger(s), initial encounter: Secondary | ICD-10-CM | POA: Diagnosis present
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
## 2021-10-03 ENCOUNTER — Emergency Department (HOSPITAL_BASED_OUTPATIENT_CLINIC_OR_DEPARTMENT_OTHER): Payer: Self-pay
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
## 2021-10-03 MED ORDER — ACETAMINOPHEN 325 MG PO TABS: 650.0000 mg | ORAL_TABLET | Freq: Once | ORAL | Status: DC

## 2021-10-03 MED ORDER — IBUPROFEN 800 MG PO TABS
800.0000 mg | ORAL_TABLET | Freq: Once | ORAL | Status: AC
  Administered 2021-10-03: 800 mg via ORAL
  Filled 2021-10-03: qty 1
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
## 2021-10-20 ENCOUNTER — Encounter (HOSPITAL_BASED_OUTPATIENT_CLINIC_OR_DEPARTMENT_OTHER): Payer: Self-pay | Admitting: Emergency Medicine

## 2021-10-20 ENCOUNTER — Other Ambulatory Visit: Payer: Self-pay

## 2021-10-20 ENCOUNTER — Emergency Department (HOSPITAL_COMMUNITY): Payer: No Typology Code available for payment source

## 2021-10-20 ENCOUNTER — Emergency Department (HOSPITAL_BASED_OUTPATIENT_CLINIC_OR_DEPARTMENT_OTHER): Payer: No Typology Code available for payment source | Admitting: Radiology

## 2021-10-20 ENCOUNTER — Emergency Department (HOSPITAL_BASED_OUTPATIENT_CLINIC_OR_DEPARTMENT_OTHER)
Admission: EM | Admit: 2021-10-20 | Discharge: 2021-10-20 | Disposition: A | Discharge status: Home / Self Care | Payer: No Typology Code available for payment source | Attending: Emergency Medicine | Admitting: Emergency Medicine

## 2021-10-20 DIAGNOSIS — Z87891 Personal history of nicotine dependence: Secondary | ICD-10-CM | POA: Diagnosis not present

## 2021-10-20 DIAGNOSIS — R531 Weakness: Secondary | ICD-10-CM | POA: Diagnosis not present

## 2021-10-20 DIAGNOSIS — R29898 Other symptoms and signs involving the musculoskeletal system: Secondary | ICD-10-CM

## 2021-10-20 DIAGNOSIS — M545 Low back pain, unspecified: Secondary | ICD-10-CM | POA: Insufficient documentation

## 2021-10-20 DIAGNOSIS — M542 Cervicalgia: Secondary | ICD-10-CM | POA: Diagnosis not present

## 2021-10-20 LAB — CBC WITH DIFFERENTIAL/PLATELET
Abs Immature Granulocytes: 0 10*3/uL (ref 0.00–0.07)
Basophils Absolute: 0 10*3/uL (ref 0.0–0.1)
Basophils Relative: 1 %
Eosinophils Absolute: 0 10*3/uL (ref 0.0–0.5)
Eosinophils Relative: 0 %
HCT: 39.7 % (ref 39.0–52.0)
Hemoglobin: 13.3 g/dL (ref 13.0–17.0)
Immature Granulocytes: 0 %
Lymphocytes Relative: 42 %
Lymphs Abs: 1.3 10*3/uL (ref 0.7–4.0)
MCH: 27.9 pg (ref 26.0–34.0)
MCHC: 33.5 g/dL (ref 30.0–36.0)
MCV: 83.4 fL (ref 80.0–100.0)
Monocytes Absolute: 0.3 10*3/uL (ref 0.1–1.0)
Monocytes Relative: 11 %
Neutro Abs: 1.4 10*3/uL — ABNORMAL LOW (ref 1.7–7.7)
Neutrophils Relative %: 46 %
Platelets: 209 10*3/uL (ref 150–400)
RBC: 4.76 MIL/uL (ref 4.22–5.81)
RDW: 11.9 % (ref 11.5–15.5)
WBC: 3.1 10*3/uL — ABNORMAL LOW (ref 4.0–10.5)
nRBC: 0 % (ref 0.0–0.2)

## 2021-10-20 LAB — BASIC METABOLIC PANEL
Anion gap: 10 (ref 5–15)
BUN: 12 mg/dL (ref 6–20)
CO2: 29 mmol/L (ref 22–32)
Calcium: 9.3 mg/dL (ref 8.9–10.3)
Chloride: 93 mmol/L — ABNORMAL LOW (ref 98–111)
Creatinine, Ser: 1.25 mg/dL — ABNORMAL HIGH (ref 0.61–1.24)
GFR, Estimated: >60 mL/min (ref >60)
Glucose, Bld: 84 mg/dL (ref 70–99)
Potassium: 4 mmol/L (ref 3.5–5.1)
Sodium: 132 mmol/L — ABNORMAL LOW (ref 135–145)
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
## 2021-10-20 MED ORDER — METHOCARBAMOL 500 MG PO TABS: 500.0000 mg | ORAL_TABLET | Freq: Two times a day (BID) | ORAL | 0 refills | Status: DC

## 2021-10-20 MED ORDER — OXYCODONE HCL 5 MG PO TABS: 5.0000 mg | ORAL_TABLET | ORAL | 0 refills | Status: DC | PRN

## 2021-10-20 MED ORDER — OXYCODONE-ACETAMINOPHEN 5-325 MG PO TABS
1.0000 | ORAL_TABLET | Freq: Once | ORAL | Status: AC
  Administered 2021-10-20: 1 via ORAL
  Filled 2021-10-20: qty 1

## 2021-10-20 MED ORDER — MORPHINE SULFATE (PF) 4 MG/ML IV SOLN
4.0000 mg | Freq: Once | INTRAVENOUS | Status: AC
  Administered 2021-10-20: 4 mg via INTRAVENOUS
  Filled 2021-10-20: qty 1

## 2021-10-20 MED ORDER — LIDOCAINE 5 % EX PTCH
1.0000 | MEDICATED_PATCH | CUTANEOUS | 0 refills | Status: DC
Start: 2021-10-20 — End: 2022-12-07
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
## 2021-10-20 NOTE — ED Notes (Signed)
Patient verbalizes understanding of discharge instructions. Opportunity for questioning and answers were provided. Armband removed by staff, pt discharged from ED. Pt taken to ED entrance via wheel chair.  
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
## 2021-10-20 NOTE — ED Notes (Signed)
Pt transported to MRI 
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
## 2021-10-20 NOTE — Discharge Instructions (Addendum)
Follow-up outpatient  Take medications as prescribed.  Do not drive or operate heavy machinery while taking these medications.  Return for new or worsening symptoms

## 2021-10-20 NOTE — ED Notes (Signed)
Pt brought back from MRI, states that they were unable to complete scan due to the 2nd scan being added which increases the time for the scans and priority scans needing to be completed. Pt will be taken back to MRI for both scans to be completed.

## 2021-10-20 NOTE — ED Notes (Signed)
Patient transported to MRI 
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
## 2022-12-07 ENCOUNTER — Ambulatory Visit (INDEPENDENT_AMBULATORY_CARE_PROVIDER_SITE_OTHER): Payer: Worker's Compensation

## 2022-12-07 ENCOUNTER — Encounter (HOSPITAL_COMMUNITY): Payer: Self-pay

## 2022-12-07 ENCOUNTER — Ambulatory Visit (HOSPITAL_COMMUNITY)
Admission: EM | Admit: 2022-12-07 | Discharge: 2022-12-07 | Disposition: A | Discharge status: Home / Self Care | Payer: Worker's Compensation | Attending: Family Medicine | Admitting: Family Medicine

## 2022-12-07 DIAGNOSIS — M79641 Pain in right hand: Secondary | ICD-10-CM

## 2022-12-07 MED ORDER — IBUPROFEN 800 MG PO TABS: 800.0000 mg | ORAL_TABLET | Freq: Three times a day (TID) | ORAL | 0 refills | Status: AC | PRN

## 2022-12-07 NOTE — ED Triage Notes (Signed)
Pt presents with right hand pain. Reports a box fell on his hand at work. Pt is experiencing pain and swelling in his right hand.

## 2022-12-07 NOTE — Discharge Instructions (Signed)
The x-ray did not show any fracture of your hand.  Take ibuprofen 800 mg--1 tab every 8 hours as needed for pain.

## 2022-12-07 NOTE — ED Provider Notes (Signed)
[facility]    CSN: [id number] Arrival date & time: 12/07/22  1724      History   Chief Complaint Chief Complaint  Patient presents with   Hand Pain    HPI Jim Hall is a 27 y.o. male.    Hand Pain  Here for right hand pain.  A heavy box fell on his hand at work from about 3 or 4 feet above his hand.  He now has pain in his right palm.    Past Medical History:  Diagnosis Date   Anal fissure    Anxiety    Depression    Hemorrhoids     There are no problems to display for this patient.   Past Surgical History:  Procedure Laterality Date   HEMORRHOID SURGERY N/A 02/19/2021   Procedure: HEMORRHOIDECTOMY;  Surgeon: Karie Soda, MD;  Location: [facility];  Service: General;  Laterality: N/A;   RECTAL EXAM UNDER ANESTHESIA N/A 02/19/2021   Procedure: RECTAL EXAM UNDER ANESTHESIA;  Surgeon: Karie Soda, MD;  Location: [facility];  Service: General;  Laterality: N/A;   SPHINCTEROTOMY N/A 02/19/2021   Procedure: ANAL SPHINCTEROTOMY;  Surgeon: Karie Soda, MD;  Location: [facility];  Service: General;  Laterality: N/A;       Home Medications    Prior to Admission medications   Medication Sig Start Date End Date Taking? Authorizing Provider  ibuprofen (ADVIL) 800 MG tablet Take 1 tablet (800 mg total) by mouth every 8 (eight) hours as needed (pain). 12/07/22  Yes Zenia Resides, MD    Family History History reviewed. No pertinent family history.  Social History Social History   Tobacco Use   Smoking status: Former    Types: Cigarettes   Smokeless tobacco: Never  Vaping Use   Vaping status: Every Day   Substances: Nicotine, THC  Substance Use Topics   Alcohol use: Yes   Drug use: Yes    Types: Marijuana     Allergies   Percocet [oxycodone-acetaminophen], Roxicodone [oxycodone], and Tylenol [acetaminophen]   Review of Systems Review of Systems   Physical Exam Triage Vital Signs ED Triage Vitals  Encounter Vitals Group     BP  12/07/22 1753 105/64     Systolic BP Percentile --      Diastolic BP Percentile --      Pulse Rate 12/07/22 1753 65     Resp 12/07/22 1753 18     Temp 12/07/22 1753 98.1 F (36.7 C)     Temp src --      SpO2 12/07/22 1753 96 %     Weight --      Height --      Head Circumference --      Peak Flow --      Pain Score 12/07/22 1752 3     Pain Loc --      Pain Education --      Exclude from Growth Chart --    No data found.  Updated Vital Signs BP 105/64   Pulse 65   Temp 98.1 F (36.7 C)   Resp 18   SpO2 96%   Visual Acuity Right Eye Distance:   Left Eye Distance:   Bilateral Distance:    Right Eye Near:   Left Eye Near:    Bilateral Near:     Physical Exam Vitals reviewed.  Constitutional:      General: He is not in acute distress.    Appearance: He is not ill-appearing,  toxic-appearing or diaphoretic.  Musculoskeletal:     Comments: There is mild tenderness of the palm of the right hand.  There is no overt swelling or deformity or bruising or erythema  Skin:    Coloration: Skin is not jaundiced or pale.  Neurological:     General: No focal deficit present.     Mental Status: He is alert and oriented to person, place, and time.  Psychiatric:        Behavior: Behavior normal.      UC Treatments / Results  Labs (all labs ordered are listed, but only abnormal results are displayed) Labs Reviewed - No data to display  EKG   Radiology DG Hand Complete Right  Result Date: 12/07/2022 CLINICAL DATA:  Pain, injury EXAM: RIGHT HAND - COMPLETE 3+ VIEW COMPARISON:  None Available. FINDINGS: There is no evidence of fracture or dislocation. There is no evidence of arthropathy or other focal bone abnormality. Soft tissues are unremarkable. Probable old distal fifth metacarpal fracture. IMPRESSION: No acute osseous abnormality. Electronically Signed   By: Jasmine Pang M.D.   On: 12/07/2022 18:33    Procedures Procedures (including critical care time)  Medications  Ordered in UC Medications - No data to display  Initial Impression / Assessment and Plan / UC Course  I have reviewed the triage vital signs and the nursing notes.  Pertinent labs & imaging results that were available during my care of the patient were reviewed by me and considered in my medical decision making (see chart for details).       There is no acute fracture of the hand on the x-ray.  There is possible an old abnormality on the fifth metacarpal.  This is not where he is hurting  Ibuprofen is sent in in case he needs it for his hand.  Final Clinical Impressions(s) / UC Diagnoses   Final diagnoses:  Right hand pain     Discharge Instructions      The x-ray did not show any fracture of your hand.  Take ibuprofen 800 mg--1 tab every 8 hours as needed for pain.       ED Prescriptions     Medication Sig Dispense Auth. Provider   ibuprofen (ADVIL) 800 MG tablet Take 1 tablet (800 mg total) by mouth every 8 (eight) hours as needed (pain). 21 tablet Yvonne Stopher, Janace Aris, MD      PDMP not reviewed this encounter.   Zenia Resides, MD 12/07/22 [phone]

## 2023-08-22 ENCOUNTER — Emergency Department (HOSPITAL_COMMUNITY): Payer: Self-pay

## 2023-08-22 ENCOUNTER — Encounter (HOSPITAL_COMMUNITY): Payer: Self-pay

## 2023-08-22 ENCOUNTER — Other Ambulatory Visit: Payer: Self-pay

## 2023-08-22 ENCOUNTER — Emergency Department (HOSPITAL_COMMUNITY)
Admission: EM | Admit: 2023-08-22 | Discharge: 2023-08-23 | Discharge status: Against Medical Advice | Payer: Self-pay | Attending: Emergency Medicine | Admitting: Emergency Medicine

## 2023-08-22 DIAGNOSIS — R2242 Localized swelling, mass and lump, left lower limb: Secondary | ICD-10-CM | POA: Insufficient documentation

## 2023-08-22 DIAGNOSIS — M79672 Pain in left foot: Secondary | ICD-10-CM | POA: Insufficient documentation

## 2023-08-22 DIAGNOSIS — Z5321 Procedure and treatment not carried out due to patient leaving prior to being seen by health care provider: Secondary | ICD-10-CM | POA: Insufficient documentation

## 2023-08-22 NOTE — ED Triage Notes (Signed)
 Stepped on a rusty nail at 9 am, left foot. Is swollen and warm to touch. Patient said that it went all the way into his foot.  Tetnus shot is not up to date.

## 2023-08-23 NOTE — ED Notes (Signed)
Pt left due to wait time  

## 2024-03-22 NOTE — Progress Notes (Signed)
 Jim Hall is a 28 y.o.  with  reports that he has been smoking cigarettes. He has never used smokeless tobacco. with  Active Ambulatory Problems    Diagnosis Date Noted  . No Active Ambulatory Problems   Resolved Ambulatory Problems    Diagnosis Date Noted  . No Resolved Ambulatory Problems   No Additional Past Medical History   who presents today at Urgent Care for  Chief Complaint  Patient presents with  . Night Sweats    For the last month, pt did have a LOC about a month and a half ago, pt thinks it started after that. Pt reports that he stopped vaping/smoking and smoking marijuana about 2 weeks ago, also thinking this could have been a cause. He has had approximately 3 total cigarettes over the last 3 days.   . Dizziness    Pt reports that he seems to be having a lot of episodes where he has near syncopal episodes since the syncopal episode about a month ago. Says this is getting worse. He changed his diet thinking this may be related.        History of Present Illness This is a 28 year old male with a history of vaping and smoking presenting with night sweats and dizziness for the last month.  The patient reports experiencing night sweats, which he believes started after he stopped vaping and smoking about 2 weeks ago. He has had approximately three cigarettes over the last three days. He does not feel particularly stressed. He also reports episodes of near syncope that have been occurring for about a month, which he attributes to his diet. He has experienced some loss of consciousness at least once. He typically leaves for work at 3 AM and is currently on leave until Monday. He expresses concern about the possibility of having diabetes or vertigo and mentions consuming a significant amount of candy. The patient does not have a primary care physician and has recently started a new job.  SOCIAL HISTORY The patient stopped vaping and smoking about 2 weeks ago but has had  approximately 3 total cigarettes over the last 3 days.      Patient has no co-morbidities  or medications that might increase the risk for developing a severe infection     reports that he has been smoking cigarettes. He has never used smokeless tobacco.  Review of Systems  Review of systems is otherwise negative except as noted in the HPI and Assessment/MDM   Physical Exam  Temp 98.2 F (36.8 C) (Oral)   Ht 1.88 m (6' 2)   Wt 81.6 kg (180 lb)   BMI 23.11 kg/m   Constitutional:      General: Patient is not in acute distress.    Appearance: Normal appearance.  Neurological:     General: No focal deficit present.     Mental Status: alert and oriented to person, place, and time.  HENT:     Eyes:     Conjunctiva/sclera: Conjunctivae normal. Pharynx normal    There is no associated anterior cervical lymphadenopathy    Pupils: Pupils are equal, round, and reactive to light.     TM's normal with no visible effusion  Cardiovascular:     Heart sounds: Normal heart sounds. No murmur heard.    No Lower extremity edema noted Pulmonary:     Effort: Pulmonary effort is normal. No respiratory distress.     Breath sounds: No wheezing, rhonchi or rales.  Abdominal:  General: Bowel sounds are normal. There is no distension.     Tenderness: There is no abdominal tenderness.  Musculoskeletal:        General: Normal range of motion.     Neck:  No rigidity.  Skin:    General: Skin is warm and dry.  Psychiatric:        Mood and Affect: Mood normal.   No orders to display            DIAGNOSIS/PLAN     1. Dizzinesses  ECG 12 lead   Orthostatic vital signs   Troponin, High Sensitive   TSH With Reflex To Free T4   Comprehensive Metabolic Panel   CBC with Differential   Hemoglobin A1C With Estimated Average Glucose   Holter monitor - 3-7 days      Assessment & Plan Initial Assessment: 28 year old male with night sweats and dizziness for the last month.  Reports loss of consciousness once. Night sweats started after stopping vaping and smoking 2 weeks ago. Episodes of near syncope. Diet possibly contributing.  Differential Diagnosis: - Cardiac strain: EKG shows left ventricle strain. Troponin test to check for muscle damage. Zio patch for continuous heart rate monitoring. - Diabetes: A1c test to evaluate average blood sugar over 3 months. Random blood sugar normal. - Thyroid dysfunction: TSH test to assess thyroid function.  ED Course: - Comprehensive metabolic panel, CBC, A1c, TSH, and troponin tests ordered. - Troponin test sent stat. - Zio patch ordered for continuous heart rate monitoring.  Final Assessment: Blood glucose levels within normal range. Vitals stable. EKG shows potential left ventricle strain. Troponin test to check for muscle damage. Zio patch for continuous heart rate monitoring. Advised caution while driving due to dizziness.  Clinical Impression: - Cardiac strain - Dizziness  Disposition: - Follow-Up: Follow up with cardiologist for Zio patch results. Primary care doctor for further evaluation in 90 days.  Patient Education: Advised caution while driving due to dizziness. Explained Zio patch use and continuous heart rate monitoring.  Portions of this note were created using the aid of voice recognition Dragon/DAX dictation software.   We discussed risks and side effects of medications, and also discussed red flags which would warrant immediate follow-up.   Urgent Care Disposition:  Home Care

## 2024-03-22 NOTE — Progress Notes (Signed)
 CBC, CMP, A1C unremarkable. No change to current treatment plan.

## 2024-03-22 NOTE — Progress Notes (Signed)
 Called and spoke with patient verified full name and dob  I did let him know that this test was normal.   Please follow recommendation made during your visit.  Always Follow up with your PCP as previously directed.

## 2024-03-26 ENCOUNTER — Emergency Department (HOSPITAL_BASED_OUTPATIENT_CLINIC_OR_DEPARTMENT_OTHER)
Admission: EM | Admit: 2024-03-26 | Discharge: 2024-03-26 | Disposition: A | Discharge status: Home / Self Care | Payer: Self-pay | Attending: Emergency Medicine | Admitting: Emergency Medicine

## 2024-03-26 ENCOUNTER — Other Ambulatory Visit: Payer: Self-pay

## 2024-03-26 ENCOUNTER — Encounter (HOSPITAL_BASED_OUTPATIENT_CLINIC_OR_DEPARTMENT_OTHER): Payer: Self-pay | Admitting: Emergency Medicine

## 2024-03-26 ENCOUNTER — Emergency Department (HOSPITAL_BASED_OUTPATIENT_CLINIC_OR_DEPARTMENT_OTHER): Payer: Self-pay | Admitting: Radiology

## 2024-03-26 ENCOUNTER — Emergency Department (HOSPITAL_BASED_OUTPATIENT_CLINIC_OR_DEPARTMENT_OTHER): Payer: Self-pay

## 2024-03-26 DIAGNOSIS — R55 Syncope and collapse: Secondary | ICD-10-CM | POA: Insufficient documentation

## 2024-03-26 DIAGNOSIS — R4020 Unspecified coma: Secondary | ICD-10-CM

## 2024-03-26 DIAGNOSIS — Z79899 Other long term (current) drug therapy: Secondary | ICD-10-CM | POA: Insufficient documentation

## 2024-03-26 LAB — COMPREHENSIVE METABOLIC PANEL WITH GFR
ALT: 9 U/L (ref 0–44)
AST: 22 U/L (ref 15–41)
Albumin: 4.5 g/dL (ref 3.5–5.0)
Alkaline Phosphatase: 79 U/L (ref 38–126)
Anion gap: 9 (ref 5–15)
BUN: 10 mg/dL (ref 6–20)
CO2: 29 mmol/L (ref 22–32)
Calcium: 9.5 mg/dL (ref 8.9–10.3)
Chloride: 102 mmol/L (ref 98–111)
Creatinine, Ser: 1.14 mg/dL (ref 0.61–1.24)
GFR, Estimated: >60 mL/min (ref >60)
Glucose, Bld: 89 mg/dL (ref 70–99)
Potassium: 3.8 mmol/L (ref 3.5–5.1)
Sodium: 139 mmol/L (ref 135–145)
Total Bilirubin: 0.4 mg/dL (ref 0.0–1.2)
Total Protein: 7.4 g/dL (ref 6.5–8.1)

## 2024-03-26 LAB — CBC WITH DIFFERENTIAL/PLATELET
Abs Immature Granulocytes: 0.01 K/uL (ref 0.00–0.07)
Basophils Absolute: 0 K/uL (ref 0.0–0.1)
Basophils Relative: 0 %
Eosinophils Absolute: 0.2 K/uL (ref 0.0–0.5)
Eosinophils Relative: 3 %
HCT: 40.4 % (ref 39.0–52.0)
Hemoglobin: 13.6 g/dL (ref 13.0–17.0)
Immature Granulocytes: 0 %
Lymphocytes Relative: 45 %
Lymphs Abs: 2.3 K/uL (ref 0.7–4.0)
MCH: 28.6 pg (ref 26.0–34.0)
MCHC: 33.7 g/dL (ref 30.0–36.0)
MCV: 84.9 fL (ref 80.0–100.0)
Monocytes Absolute: 0.5 K/uL (ref 0.1–1.0)
Monocytes Relative: 9 %
Neutro Abs: 2.2 K/uL (ref 1.7–7.7)
Neutrophils Relative %: 43 %
Platelets: 252 K/uL (ref 150–400)
RBC: 4.76 MIL/uL (ref 4.22–5.81)
RDW: 12.7 % (ref 11.5–15.5)
WBC: 5.2 K/uL (ref 4.0–10.5)
nRBC: 0 % (ref 0.0–0.2)

## 2024-03-26 LAB — URINE DRUG SCREEN
Amphetamines: NEGATIVE
Barbiturates: NEGATIVE
Benzodiazepines: NEGATIVE
Cocaine: NEGATIVE
Fentanyl: NEGATIVE
Methadone Scn, Ur: NEGATIVE
Opiates: NEGATIVE
Tetrahydrocannabinol: NEGATIVE

## 2024-03-26 MED ORDER — SODIUM CHLORIDE 0.9 % IV BOLUS
1000.0000 mL | Freq: Once | INTRAVENOUS | Status: AC
  Administered 2024-03-26: 1000 mL via INTRAVENOUS

## 2024-03-26 NOTE — ED Provider Notes (Signed)
 [facility] Provider Note   CSN: [id number] Arrival date & time: 03/26/24  1314     Patient presents with: Loss of Consciousness   Jim Hall is a 28 y.o. male with no pertinent past medical history who presents emergency department for evaluation of multiple episodes of LOC this morning.  Patient states he was at work using the bathroom, when he woke up and was on the floor.  He was assisted by a coworker who helped him to the toilet when he reportedly lost consciousness again.  Patient states this has been ongoing for the last 3-4 months.  Patient reports his first episode of LOC was when he was having intercourse with his wife.  He was evaluated by primary care on 11/14 who placed him on a Zio patch.  At this time, patient reports occasional headache, nausea, weakness and fatigue.  He denies incontinence after the episodes.  No paresthesias.  Of note, patient does report his mother told him he had multiple episodes of seizure activity when he was a young child.  He does not take any medication for this at this time.    Loss of Consciousness      Prior to Admission medications   Medication Sig Start Date End Date Taking? Authorizing Provider  ibuprofen  (ADVIL ) 800 MG tablet Take 1 tablet (800 mg total) by mouth every 8 (eight) hours as needed (pain). 12/07/22   Vonna Sharlet POUR, MD    Allergies: Percocet [oxycodone -acetaminophen ], Roxicodone  Dai.craver ], and Tylenol  [acetaminophen ]    Review of Systems  Cardiovascular:  Positive for syncope.    Updated Vital Signs BP 128/87   Pulse 68   Temp 98.4 F (36.9 C)   Resp 19   SpO2 100%   Physical Exam  (all labs ordered are listed, but only abnormal results are displayed) Labs Reviewed  CBC WITH DIFFERENTIAL/PLATELET  COMPREHENSIVE METABOLIC PANEL WITH GFR  URINE DRUG SCREEN    EKG: EKG Interpretation Date/Time:  Monday March 26 2024 13:22:19 EST Ventricular Rate:   73 PR Interval:    QRS Duration:  86 QT Interval:  362 QTC Calculation: 398 R Axis:   46  Text Interpretation: Normal sinus rhythm Confirmed by Ruthe Cornet [phone]) on 03/26/2024 2:03:29 PM  Radiology: DG Chest 2 View Result Date: 03/26/2024 EXAM: 2 VIEW(S) XRAY OF THE CHEST 03/26/2024 01:42:00 PM COMPARISON: 02/18/2014 CLINICAL HISTORY: Syncope episode for 30 minutes. FINDINGS: LUNGS AND PLEURA: No focal pulmonary opacity. No pleural effusion. No pneumothorax. HEART AND MEDIASTINUM: There is a rounded cardiac monitoring device noted in the projection of the left upper lobe. No acute abnormality of the cardiac and mediastinal silhouettes. BONES AND SOFT TISSUES: No acute osseous abnormality. IMPRESSION: 1. No acute cardiopulmonary process. 2. Cardiac monitoring device projecting over the left upper lobe. Electronically signed by: Waddell Calk MD 03/26/2024 02:38 PM EST RP Workstation: [id number]   CT Head Wo Contrast Result Date: 03/26/2024 EXAM: CT HEAD WITHOUT CONTRAST 03/26/2024 01:48:31 PM TECHNIQUE: CT of the head was performed without the administration of intravenous contrast. Automated exposure control, iterative reconstruction, and/or weight based adjustment of the mA/kV was utilized to reduce the radiation dose to as low as reasonably achievable. COMPARISON: None available. CLINICAL HISTORY: Polytrauma, blunt Polytrauma, blunt FINDINGS: BRAIN AND VENTRICLES: No acute hemorrhage. No evidence of acute infarct. No hydrocephalus. No extra-axial collection. No mass effect or midline shift. ORBITS: No acute abnormality. SINUSES: No acute abnormality. SOFT TISSUES AND SKULL: No acute soft tissue abnormality.  No skull fracture. IMPRESSION: 1. No acute intracranial abnormality. Electronically signed by: Donnice Mania MD 03/26/2024 02:31 PM EST RP Workstation: [id number]    Procedures   Medications Ordered in the ED  sodium chloride  0.9 % bolus 1,000 mL (0 mLs Intravenous Stopped 03/26/24  1722)                                Medical Decision Making Amount and/or Complexity of Data Reviewed Labs: ordered. Radiology: ordered.   This patient presents to the ED for concern of syncope, this involves an extensive number of treatment options, and is a complaint that carries with it a high risk of complications and morbidity.  Differential diagnosis includes: vasovagal syncope, seizure, orthostatic hypotension, cardiac arrhythmia, psychogenic pseudoseizure  Co morbidities:  none   Additional history: Patient evaluated by primary care on 11/14 for the same complaint.  Patient was prescribed a Zio patch at that time.  Lab Tests:  I Ordered, and personally interpreted labs.  The pertinent results include: No acute abnormalities  Imaging Studies:  I ordered imaging studies including CT head without contrast and chest x-ray I independently visualized and interpreted imaging which showed no acute intracranial abnormality or cardiopulmonary process to account for patient's symptoms I agree with the radiologist interpretation  Cardiac Monitoring/ECG:  The patient was maintained on a cardiac monitor.  I personally viewed and interpreted the cardiac monitored which showed an underlying rhythm of: Normal sinus rhythm  Medicines ordered and prescription drug management:  I ordered medication including  Medications  sodium chloride  0.9 % bolus 1,000 mL (0 mLs Intravenous Stopped 03/26/24 1722)   for weakness after syncope Reevaluation of the patient after these medicines showed that the patient improved I have reviewed the patients home medicines and have made adjustments as needed  Test Considered:   none  Critical Interventions:   none  Consultations Obtained: None  Problem List / ED Course:     ICD-10-CM   1. Syncope, unspecified syncope type  R55     2. Loss of consciousness (HCC)  R40.20 Ambulatory referral to Cardiology    Ambulatory referral to Neurology       MDM: 28 year old male who presents to the emergency department for evaluation after multiple episodes of LOC this morning.  Per patient history, he has had these episodes intermittently for the last 3-4 months.  Patient's first episode of LOC was reportedly when he was having intercourse with his wife.  He has occasional prodrome symptoms including dizziness, but not all the time.  No episodes of incontinence during these episodes.  Patient was evaluated by his PCP on 11/14 and was given a Zio patch order.  Patient reports he put the Zio patch on earlier this morning.  I feel patient would benefit from cardiology and neurology follow-up to assess for further cause of his symptoms.  At this time, we were unable to capture any rhythm abnormalities on patient's telemetry monitoring.  Additionally, his chest x-ray showed no acute cardiopulmonary process.  His CT head was unremarkable and shows no intracranial abnormality.  I did order a liter of IV fluids for patient's weakness after syncopal episode.  Upon reassessment, patient reported he was feeling better.  Given that there was no acute event while he was in the emergency department, I feel he is appropriate for discharge with close follow-up to cardiology and neurology.  I have placed ambulatory referrals for both  of these services.  I educated patient that if he does not hear from them in the next 1-2 business days that he should call to schedule a follow-up appointment.  He verbalizes understanding to this.  I have also informed patient that if he has another syncopal event that he should report back to the emergency department.  Patient is agreeable to this.  His vital signs are stable at this time.  Patient is appropriate for discharge.  Additionally, patient is requesting a leave of absence from his job.  He states he will need to bring the paperwork back to this emergency department to have a provider fill out.  Patient works at a [employer] and is responsible for lifting boxes.  At this time, due to the unpredictable nature of his syncopal events, I do not feel it is safe for him to return to work at this time.  Dispostion:  After consideration of the diagnostic results and the patients response to treatment, I feel that the patient would benefit from close follow-up with cardiology and neurology.   Final diagnoses:  Loss of consciousness (HCC)  Syncope, unspecified syncope type    ED Discharge Orders          Ordered    Ambulatory referral to Cardiology        03/26/24 1738    Ambulatory referral to Neurology       Comments: An appointment is requested in approximately: 1 week   03/26/24 1738               Torrence Marry RAMAN, PA-C 03/26/24 2101    Ruthe Cornet, DO 03/30/24 1621

## 2024-03-26 NOTE — Discharge Instructions (Addendum)
 It was a pleasure taking care of you today. You were seen in the Emergency Department for multiple episodes of loss of consciousness. Your work-up was reassuring. Your CT and lab work showed no acute abnormality that would explain your symptoms. as we discussed, I am giving you an ambulatory referral to cardiology and neurology as I suspect a further workup is indicated.  Cardiology and neurology should call you within the next 1-2 business days to schedule a follow-up appointment.  If you do not hear from them by that time, please call to schedule an appointment.  Their contact information is listed on your discharge paperwork.  Refer to the attached documentation for more information on episodes of syncope.  Additionally, please exercise caution and refrain from driving as you may cause injury to yourself or others if you lose consciousness behind the wheel.  If you have another episode of loss of consciousness, please report back to the emergency department for additional workup.  Please return to the ER if you experience chest pain, trouble breathing, intractable nausea/vomiting or any other life threatening illnesses.

## 2024-03-26 NOTE — ED Notes (Signed)
 Reviewed discharge instructions and follow-up care with pt. Pt verbalized understanding and had no further questions. Pt exited ED without complications.

## 2024-03-26 NOTE — ED Triage Notes (Signed)
 Reports syncopal episode for 30 minutes Denies hitting head. States been having similar episodes for the past 3 months.

## 2024-03-26 NOTE — ED Notes (Signed)
 ED Provider at bedside.

## 2024-04-02 ENCOUNTER — Encounter: Payer: Self-pay | Admitting: Neurology

## 2024-04-09 ENCOUNTER — Other Ambulatory Visit: Payer: Self-pay

## 2024-04-09 ENCOUNTER — Encounter (HOSPITAL_BASED_OUTPATIENT_CLINIC_OR_DEPARTMENT_OTHER): Payer: Self-pay

## 2024-04-09 ENCOUNTER — Emergency Department (HOSPITAL_BASED_OUTPATIENT_CLINIC_OR_DEPARTMENT_OTHER)
Admission: EM | Admit: 2024-04-09 | Discharge: 2024-04-09 | Disposition: A | Discharge status: Home / Self Care | Payer: Self-pay | Attending: Emergency Medicine | Admitting: Emergency Medicine

## 2024-04-09 DIAGNOSIS — R200 Anesthesia of skin: Secondary | ICD-10-CM

## 2024-04-09 DIAGNOSIS — R55 Syncope and collapse: Secondary | ICD-10-CM

## 2024-04-09 LAB — COMPREHENSIVE METABOLIC PANEL WITH GFR
ALT: 11 U/L (ref 0–44)
AST: 24 U/L (ref 15–41)
Albumin: 4.5 g/dL (ref 3.5–5.0)
Alkaline Phosphatase: 86 U/L (ref 38–126)
Anion gap: 8 (ref 5–15)
BUN: 12 mg/dL (ref 6–20)
CO2: 30 mmol/L (ref 22–32)
Calcium: 10.3 mg/dL (ref 8.9–10.3)
Chloride: 99 mmol/L (ref 98–111)
Creatinine, Ser: 1.04 mg/dL (ref 0.61–1.24)
GFR, Estimated: >60 mL/min (ref >60)
Glucose, Bld: 118 mg/dL — ABNORMAL HIGH (ref 70–99)
Potassium: 4.5 mmol/L (ref 3.5–5.1)
Sodium: 137 mmol/L (ref 135–145)
Total Bilirubin: 0.5 mg/dL (ref 0.0–1.2)
Total Protein: 8.2 g/dL — ABNORMAL HIGH (ref 6.5–8.1)

## 2024-04-09 LAB — CBC WITH DIFFERENTIAL/PLATELET
Abs Immature Granulocytes: 0.01 K/uL (ref 0.00–0.07)
Basophils Absolute: 0 K/uL (ref 0.0–0.1)
Basophils Relative: 1 %
Eosinophils Absolute: 0.2 K/uL (ref 0.0–0.5)
Eosinophils Relative: 4 %
HCT: 46.8 % (ref 39.0–52.0)
Hemoglobin: 15.5 g/dL (ref 13.0–17.0)
Immature Granulocytes: 0 %
Lymphocytes Relative: 50 %
Lymphs Abs: 1.8 K/uL (ref 0.7–4.0)
MCH: 28.4 pg (ref 26.0–34.0)
MCHC: 33.1 g/dL (ref 30.0–36.0)
MCV: 85.9 fL (ref 80.0–100.0)
Monocytes Absolute: 0.3 K/uL (ref 0.1–1.0)
Monocytes Relative: 7 %
Neutro Abs: 1.4 K/uL — ABNORMAL LOW (ref 1.7–7.7)
Neutrophils Relative %: 38 %
Platelets: 239 K/uL (ref 150–400)
RBC: 5.45 MIL/uL (ref 4.22–5.81)
RDW: 12.7 % (ref 11.5–15.5)
WBC: 3.6 K/uL — ABNORMAL LOW (ref 4.0–10.5)
nRBC: 0 % (ref 0.0–0.2)

## 2024-04-09 LAB — TROPONIN T, HIGH SENSITIVITY: Troponin T High Sensitivity: <15 ng/L (ref 0–19)

## 2024-04-09 LAB — MAGNESIUM: Magnesium: 1.9 mg/dL (ref 1.7–2.4)

## 2024-04-09 NOTE — ED Triage Notes (Addendum)
 Pt c/o recurrent synocopal episodes x3 mos; seen 11/17 for same, one episode since.  Woke up this morning w numb L foot, had to have my wife come rub it. Advises complete symptom resolution after foot rub.  Reports Jan follow up appt w cardiologist, neuro

## 2024-04-09 NOTE — Discharge Instructions (Addendum)
 It was a pleasure taking care of you here today.  I placed a urgent referral to neurology and cardiology.  Return for new or worsening symptoms

## 2024-04-09 NOTE — ED Provider Notes (Signed)
 [place] EMERGENCY DEPARTMENT AT [facility] Provider Note   CSN: [id number] Arrival date & time: 04/09/24  1225    Patient presents with: Dizziness   Jim Hall is a 28 y.o. male hx of recurrent syncope here for evaluation of left foot tingling.  Patient states he woke up this morning and his left foot was tingling from midfoot distally.  He states wife rubbed his foot and his sensation improved.  He denies any weakness.  He is unsure if his foot was weak.  He denies any recent injury or trauma.  He states he has been currently referred to cardiology and neurology for recurrent syncope.  He states he has not had any further syncope since he was seen here about 3 weeks ago.  He states he can typically feel like when his syncopal episodes are going to occur he feels blurry in the head and if I shake my head I can, snap out of it.  He states that when he does feel the sensation he stands he does have a syncopal episode.  He denies any sudden onset thunderclap headache.  No diplopia, weakness, slurred speech.  He has been eating and drinking normally.  No chest pain, shortness of breath, abd pain, nausea or vomiting. Has chronic lower back pain, unchanged.  No history of IVDU, bowel or bladder incontinence, saddle paresthesia. No post ictal phase, incontinence, tongue injury when he dose have an episode.  States he needs a return to work note. Was taken out of work until 11/30 however needs to return to work. No recurrent syncope since last ED visit. No seizure like activity.   HPI     Prior to Admission medications   Medication Sig Start Date End Date Taking? Authorizing Provider  ibuprofen  (ADVIL ) 800 MG tablet Take 1 tablet (800 mg total) by mouth every 8 (eight) hours as needed (pain). 12/07/22   Banister, Pamela K, MD    Allergies: Percocet [oxycodone -acetaminophen ], Roxicodone  [oxycodone ], and Tylenol  [acetaminophen ]    Review of Systems  Constitutional: Negative.    HENT: Negative.    Respiratory: Negative.    Cardiovascular: Negative.   Gastrointestinal: Negative.   Musculoskeletal: Negative.   Neurological:  Positive for syncope (recurrent- no episodes since last ED visit 11/17) and numbness (tingling left toe, resolved).  All other systems reviewed and are negative.   Updated Vital Signs BP 138/89   Pulse 85   Temp 98.2 F (36.8 C)   Resp (!) 22   SpO2 100%   Physical Exam Physical Exam  Constitutional: Pt is oriented to person, place, and time. Pt appears well-developed and well-nourished. No distress.  HENT:  Head: Normocephalic and atraumatic.  Mouth/Throat: Oropharynx is clear and moist.  Eyes: Conjunctivae and EOM are normal. Pupils are equal, round, and reactive to light. No scleral icterus.  No horizontal, vertical or rotational nystagmus  Neck: Normal range of motion. Neck supple.  Full active and passive ROM without pain No midline or paraspinal tenderness No nuchal rigidity or meningeal signs  Cardiovascular: Normal rate, regular rhythm and intact distal pulses.   Pulmonary/Chest: Effort normal and breath sounds normal. No respiratory distress. Pt has no wheezes. No rales.  Abdominal: Soft. Bowel sounds are normal. There is no tenderness. There is no rebound and no guarding.  Musculoskeletal: Normal range of motion.  Lymphadenopathy:    No cervical adenopathy.  Neurological: Pt. is alert and oriented to person, place, and time. He has normal reflexes. No cranial nerve deficit.  Exhibits normal muscle tone. Coordination normal.  Mental Status:  Alert, oriented, thought content appropriate. Speech fluent without evidence of aphasia. Able to follow 2 step commands without difficulty.  Cranial Nerves:  2-12 grossly intact Motor:  Equal strength BIL upper and lower extremities Sensory:intact sensation  Cerebellar: normal F2N  Gait: normal gait and balance CV: distal pulses palpable throughout   Skin: Skin is warm and dry.  No rash noted. Pt is not diaphoretic.  Psychiatric: Pt has a normal mood and affect. Behavior is normal. Judgment and thought content normal.  Nursing note and vitals reviewed.  (all labs ordered are listed, but only abnormal results are displayed) Labs Reviewed  COMPREHENSIVE METABOLIC PANEL WITH GFR - Abnormal; Notable for the following components:      Result Value   Glucose, Bld 118 (*)    Total Protein 8.2 (*)    All other components within normal limits  CBC WITH DIFFERENTIAL/PLATELET - Abnormal; Notable for the following components:   WBC 3.6 (*)    Neutro Abs 1.4 (*)    All other components within normal limits  MAGNESIUM  TROPONIN T, HIGH SENSITIVITY  TROPONIN T, HIGH SENSITIVITY    EKG: EKG Interpretation Date/Time:  Monday April 09 2024 14:12:23 EST Ventricular Rate:  72 PR Interval:  151 QRS Duration:  81 QT Interval:  381 QTC Calculation: 417 R Axis:   94  Text Interpretation: Sinus rhythm Borderline right axis deviation Probable anteroseptal infarct, old Confirmed by Cottie Cough [id number]) on 04/09/2024 3:11:39 PM  Radiology: No results found.   Procedures   Medications Ordered in the ED - No data to display  28 year old history of recurrent syncope here for evaluation of left foot being numb when he woke up this morning.  Significant other massaged his foot which returned to normal sensation.  He was concerned given his history of recurrent syncope that something was abnormal.  He was seen in the ED about 2 to 3 weeks ago for syncope.  Has not had syncope since.  He was referred to neurology and cardiology however states his appointments are months out.  Here he has a nonfocal neuroexam.  No midline back pain.  He is neurovascularly intact.  He is currently asymptomatic.  Thankfully no recurrence of his syncope from his prior ED visit.  Question whether positioning was causing his abnormal sensation in his foot he denies any recent injury or trauma.  He has no  obvious infectious process on exam he has 2+ DP, PT pulses.  He has a nonfocal neuroexam.  No seizure-like activity will plan on rechecking labs.  Labs personally viewed and interpreted:  CBC leukopenia WBC 3.6 Metabolic panel glucose 118 Magnesium 1.9 Troponin less than 15 EKG without ischemic changes, QTc 417  Discussed results, with patient, significant other in room.  Will have him keep his follow-up with cardiology and neurology.  Low suspicion for VTE, ischemia, infectious process, demyelinating process, cauda equina, discitis, osteomyelitis, transverse myelitis, Guillain-Barr, aneurysm, dissection, electrolyte abnormality, injury as cause of his symptoms.  Will have him follow-up outpatient, return for new or worsening symptoms.  The patient has been appropriately medically screened and/or stabilized in the ED. I have low suspicion for any other emergent medical condition which would require further screening, evaluation or treatment in the ED or require inpatient management.  Patient is hemodynamically stable and in no acute distress.  Patient able to ambulate in department prior to ED.  Evaluation does not show acute pathology that would require  ongoing or additional emergent interventions while in the emergency department or further inpatient treatment.  I have discussed the diagnosis with the patient and answered all questions.  Pain is been managed while in the emergency department and patient has no further complaints prior to discharge.  Patient is comfortable with plan discussed in room and is stable for discharge at this time.  I have discussed strict return precautions for returning to the emergency department.  Patient was encouraged to follow-up with PCP/specialist refer to at discharge.                                   Medical Decision Making Amount and/or Complexity of Data Reviewed Independent Historian: spouse External Data Reviewed: labs, radiology, ECG and  notes. Labs: ordered. Decision-making details documented in ED Course. ECG/medicine tests: ordered and independent interpretation performed. Decision-making details documented in ED Course.  Risk OTC drugs. Decision regarding hospitalization. Diagnosis or treatment significantly limited by social determinants of health.        Final diagnoses:  Numbness and tingling of foot  Recurrent syncope    ED Discharge Orders          Ordered    Ambulatory referral to Neurology       Comments: An appointment is requested in approximately: 1 week   04/09/24 1534    Ambulatory referral to Cardiology       Comments: If you have not heard from the Cardiology office within the next 72 hours please call [phone].   04/09/24 1534               Kiri Hinderliter A, PA-C 04/09/24 2157    Rogelia Jerilynn RAMAN, MD 04/17/24 1231

## 2024-04-09 NOTE — ED Notes (Signed)
 Called Alexis in lab to run hold blood.

## 2024-05-07 ENCOUNTER — Other Ambulatory Visit: Payer: Self-pay

## 2024-05-07 ENCOUNTER — Emergency Department (HOSPITAL_BASED_OUTPATIENT_CLINIC_OR_DEPARTMENT_OTHER)
Admission: EM | Admit: 2024-05-07 | Discharge: 2024-05-07 | Discharge status: Against Medical Advice | Payer: Self-pay | Source: Home / Self Care

## 2024-05-08 ENCOUNTER — Emergency Department (HOSPITAL_BASED_OUTPATIENT_CLINIC_OR_DEPARTMENT_OTHER): Payer: Self-pay

## 2024-05-08 ENCOUNTER — Emergency Department (HOSPITAL_BASED_OUTPATIENT_CLINIC_OR_DEPARTMENT_OTHER)
Admission: EM | Admit: 2024-05-08 | Discharge: 2024-05-08 | Disposition: A | Discharge status: Home / Self Care | Payer: Self-pay | Attending: Emergency Medicine | Admitting: Emergency Medicine

## 2024-05-08 ENCOUNTER — Encounter (HOSPITAL_BASED_OUTPATIENT_CLINIC_OR_DEPARTMENT_OTHER): Payer: Self-pay | Admitting: Emergency Medicine

## 2024-05-08 DIAGNOSIS — R55 Syncope and collapse: Secondary | ICD-10-CM | POA: Insufficient documentation

## 2024-05-08 LAB — CBC WITH DIFFERENTIAL/PLATELET
Abs Immature Granulocytes: 0 K/uL (ref 0.00–0.07)
Basophils Absolute: 0 K/uL (ref 0.0–0.1)
Basophils Relative: 1 %
Eosinophils Absolute: 0.2 K/uL (ref 0.0–0.5)
Eosinophils Relative: 5 %
HCT: 41.3 % (ref 39.0–52.0)
Hemoglobin: 13.8 g/dL (ref 13.0–17.0)
Immature Granulocytes: 0 %
Lymphocytes Relative: 47 %
Lymphs Abs: 2 K/uL (ref 0.7–4.0)
MCH: 28.6 pg (ref 26.0–34.0)
MCHC: 33.4 g/dL (ref 30.0–36.0)
MCV: 85.5 fL (ref 80.0–100.0)
Monocytes Absolute: 0.4 K/uL (ref 0.1–1.0)
Monocytes Relative: 8 %
Neutro Abs: 1.6 K/uL — ABNORMAL LOW (ref 1.7–7.7)
Neutrophils Relative %: 39 %
Platelets: 246 K/uL (ref 150–400)
RBC: 4.83 MIL/uL (ref 4.22–5.81)
RDW: 12.8 % (ref 11.5–15.5)
WBC: 4.2 K/uL (ref 4.0–10.5)
nRBC: 0 % (ref 0.0–0.2)

## 2024-05-08 LAB — COMPREHENSIVE METABOLIC PANEL WITH GFR
ALT: 10 U/L (ref 0–44)
AST: 20 U/L (ref 15–41)
Albumin: 4.3 g/dL (ref 3.5–5.0)
Alkaline Phosphatase: 82 U/L (ref 38–126)
Anion gap: 8 (ref 5–15)
BUN: 8 mg/dL (ref 6–20)
CO2: 27 mmol/L (ref 22–32)
Calcium: 9.8 mg/dL (ref 8.9–10.3)
Chloride: 103 mmol/L (ref 98–111)
Creatinine, Ser: 1.02 mg/dL (ref 0.61–1.24)
GFR, Estimated: >60 mL/min
Glucose, Bld: 90 mg/dL (ref 70–99)
Potassium: 4.1 mmol/L (ref 3.5–5.1)
Sodium: 139 mmol/L (ref 135–145)
Total Bilirubin: 0.3 mg/dL (ref 0.0–1.2)
Total Protein: 7.2 g/dL (ref 6.5–8.1)

## 2024-05-08 MED ORDER — SODIUM CHLORIDE 0.9 % IV BOLUS
1000.0000 mL | Freq: Once | INTRAVENOUS | Status: AC
  Administered 2024-05-08: 1000 mL via INTRAVENOUS

## 2024-05-08 MED ORDER — IOHEXOL 350 MG/ML SOLN
100.0000 mL | Freq: Once | INTRAVENOUS | Status: AC | PRN
  Administered 2024-05-08: 75 mL via INTRAVENOUS

## 2024-05-08 NOTE — Discharge Instructions (Addendum)
 Follow-up with the cardiologist and neurologist as you have been scheduled.  If you develop any new or worsening or concerning symptoms then return to the ER.

## 2024-05-08 NOTE — ED Triage Notes (Signed)
 Reports near syncope episode yesterday while driving. States had to pull over. Reports headache since.  Seen here multiple times here for same over the last month. Had cardiologist and neurologist appt in January.

## 2024-05-08 NOTE — ED Provider Notes (Signed)
 " [place] EMERGENCY DEPARTMENT AT [facility] Provider Note   CSN: [id number] Arrival date & time: 05/08/24  9263     Patient presents with: Near Syncope   Jim Hall is a 28 y.o. male.   HPI 28 year old male presents with recurrent syncope.  He has been dealing with headaches and syncope for several months.  He feels like it is progressing and getting worse.  He has been having multiple episodes of syncope without a clear cause.  Over the last month he has been getting headaches.  He has a continuous frontal pressure for the past month but at times, usually just before passing out, he will have a sharp left-sided frontal headache above his eye.  It will eventually pass after he wakes back up.  His mom witnessed one of the passing out episodes several months ago and she relates there was no seizure-like activity when I asked.  He was just unconscious briefly.  Patient states yesterday morning around 4 AM he was driving to work and felt recurrent headache and pulled over and then had recurrent syncope.  He does feel lightheaded before this happened.  He denies any chest pain or shortness of breath.  He has seen urgent care and had a Zio patch applied.  He was told he had an irregular heartbeat but does not know anything else and has an outpatient cardiology appointment on January 9.  Besides the mild head pressure, he otherwise feels normal currently.  He did seek medical care but due to the waiting room size he did not check in yesterday.  Prior to Admission medications  Medication Sig Start Date End Date Taking? Authorizing Provider  ibuprofen  (ADVIL ) 800 MG tablet Take 1 tablet (800 mg total) by mouth every 8 (eight) hours as needed (pain). 12/07/22   Vonna Sharlet POUR, MD    Allergies: Percocet [oxycodone -acetaminophen ], Roxicodone  [oxycodone ], and Tylenol  [acetaminophen ]    Review of Systems  Respiratory:  Negative for shortness of breath.   Cardiovascular:  Negative  for chest pain and palpitations.  Neurological:  Positive for syncope, light-headedness and headaches.    Updated Vital Signs BP 116/68   Pulse (!) 48   Temp 98.4 F (36.9 C) (Oral)   Resp (!) 22   SpO2 99%   Physical Exam Vitals and nursing note reviewed.  Constitutional:      General: He is not in acute distress.    Appearance: He is well-developed. He is not ill-appearing or diaphoretic.  HENT:     Head: Normocephalic and atraumatic.  Eyes:     Extraocular Movements: Extraocular movements intact.     Pupils: Pupils are equal, round, and reactive to light.  Cardiovascular:     Rate and Rhythm: Normal rate and regular rhythm.     Heart sounds: Normal heart sounds. No murmur heard. Pulmonary:     Effort: Pulmonary effort is normal.     Breath sounds: Normal breath sounds.  Abdominal:     Palpations: Abdomen is soft.     Tenderness: There is no abdominal tenderness.  Skin:    General: Skin is warm and dry.  Neurological:     Mental Status: He is alert.     Comments: CN 3-12 grossly intact. 5/5 strength in all 4 extremities. Grossly normal sensation. Normal finger to nose.      (all labs ordered are listed, but only abnormal results are displayed) Labs Reviewed  CBC WITH DIFFERENTIAL/PLATELET - Abnormal; Notable for the following components:  Result Value   Neutro Abs 1.6 (*)    All other components within normal limits  COMPREHENSIVE METABOLIC PANEL WITH GFR    EKG: EKG Interpretation Date/Time:  Tuesday May 08 2024 09:24:49 EST Ventricular Rate:  66 PR Interval:  163 QRS Duration:  82 QT Interval:  392 QTC Calculation: 411 R Axis:   106  Text Interpretation: Sinus rhythm Lateral infarct, old Anteroseptal infarct, age indeterminate no significant change since Apr 09 2024 Confirmed by Freddi Hamilton [phone]) on 05/08/2024 10:32:34 AM  Radiology: CT ANGIO HEAD NECK W WO CM Result Date: 05/08/2024 EXAM: CTA Head and Neck with Intravenous Contrast. CT  Head without Contrast. CLINICAL HISTORY: Eval for sudden headaches. TECHNIQUE: Axial CTA images of the head and neck performed with intravenous contrast. MIP reconstructed images were created and reviewed. Axial computed tomography images of the head/brain performed without intravenous contrast. Note: Per PQRS, the description of internal carotid artery percent stenosis, including 0 percent or normal exam, is based on North American Symptomatic Carotid Endarterectomy Trial (NASCET) criteria. Dose reduction technique was used including one or more of the following: automated exposure control, adjustment of mA and kV according to patient size, and/or iterative reconstruction. CONTRAST: With; COMPARISON: None provided. FINDINGS: CT HEAD: BRAIN: No acute intraparenchymal hemorrhage. No mass lesion. No CT evidence for acute territorial infarct. No midline shift or extra-axial collection. VENTRICLES: No hydrocephalus. ORBITS: The orbits are unremarkable. SINUSES AND MASTOIDS: The paranasal sinuses and mastoid air cells are clear. CTA NECK: COMMON CAROTID ARTERIES: No significant stenosis. No dissection or occlusion. INTERNAL CAROTID ARTERIES: No stenosis by NASCET criteria. No dissection or occlusion. VERTEBRAL ARTERIES: No significant stenosis. No dissection or occlusion. CTA HEAD: ANTERIOR CEREBRAL ARTERIES: No significant stenosis. No occlusion. No aneurysm. MIDDLE CEREBRAL ARTERIES: No significant stenosis. No occlusion. No aneurysm. POSTERIOR CEREBRAL ARTERIES: No significant stenosis. No occlusion. No aneurysm. BASILAR ARTERY: No significant stenosis. No occlusion. No aneurysm. OTHER: SOFT TISSUES: No acute finding. No masses or lymphadenopathy. BONES: No acute osseous abnormality. IMPRESSION: 1. No acute intracranial hemorrhage or ischemic change. 2. No evidence of significant stenosis, aneurysmal dilatation, or dissection involving the arteries of the head and neck. Electronically signed by: Evalene Coho MD  05/08/2024 11:04 AM EST RP Workstation: [id number]     Procedures   Medications Ordered in the ED  sodium chloride  0.9 % bolus 1,000 mL (0 mLs Intravenous Stopped 05/08/24 1113)  iohexol  (OMNIPAQUE ) 350 MG/ML injection 100 mL (75 mLs Intravenous Contrast Given 05/08/24 1037)                                    Medical Decision Making Amount and/or Complexity of Data Reviewed External Data Reviewed: notes. Labs: ordered.    Details: Normal hemoglobin Radiology: ordered and independent interpretation performed.    Details: No cerebral aneurysm ECG/medicine tests: ordered and independent interpretation performed.    Details: Sinus rhythm  Risk Prescription drug management.   Patient presents with recurrent syncope.  He is asymptomatic currently besides the headache he has been having for several weeks.  He reports a sudden worsening of headache every time he is about to have syncope.  Due to this a CTA was obtained but is unremarkable.  I doubt he has been having recurrent subarachnoid hemorrhages for several months.  He does not have any meningismus, AMS, fever, etc.  On chart review, there is a report from his Zio patch on an 11/13  encounter that was interpreted on 12/5.  I discussed these results with Dr. Lavona, who has viewed these results as well.  In his interpretation there is nothing on those results that would account for his syncope or be a cause of concern at this time.  Can continue his outpatient workup and cardiology visit that is coming up.  Labs and other workup are reassuring.  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  It does not sound like he has been having any seizures, but does need to be careful.  He otherwise appears stable for discharge home with return precautions.     Final diagnoses:  Recurrent syncope    ED Discharge Orders     None           Freddi Hamilton, MD 05/08/24 1128  "
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
## 2024-05-16 ENCOUNTER — Ambulatory Visit: Payer: Self-pay | Admitting: Neurology

## 2024-05-16 ENCOUNTER — Encounter: Payer: Self-pay | Admitting: Neurology

## 2024-05-16 VITALS — BP 125/75 | HR 62 | Ht 74.0 in | Wt 188.6 lb

## 2024-05-16 DIAGNOSIS — R519 Headache, unspecified: Secondary | ICD-10-CM

## 2024-05-16 DIAGNOSIS — R55 Syncope and collapse: Secondary | ICD-10-CM

## 2024-05-16 MED ORDER — NORTRIPTYLINE HCL 10 MG PO CAPS: 10.0000 mg | ORAL_CAPSULE | Freq: Every day | ORAL | 11 refills | Status: AC

## 2024-05-16 NOTE — Patient Instructions (Signed)
 Good to meet you.  Schedule MRI brain with and without contrast  2. Schedule 1-hour EEG  3. Referral will be sent to Cardiology  4. Start Nortriptyline  10mg : take 1 capsule every night. Start keeping a calendar of the headaches  5. It is prudent to recommend that all persons should be free of syncopal (passing out) episodes for at least six months to be granted the driving privilege. (THE [place]  PHYSICIAN'S GUIDE TO DRIVER MEDICAL EVALUATION, Second Edition, Medical Review Branch, Associate Professor, Division of [employer], [place]  Department of Transportation, July 2004)    6. Follow-up in 3 months, call for any changes

## 2024-05-17 ENCOUNTER — Ambulatory Visit: Admitting: Student

## 2024-05-17 ENCOUNTER — Ambulatory Visit: Payer: Self-pay | Admitting: Neurology

## 2024-05-17 DIAGNOSIS — R519 Headache, unspecified: Secondary | ICD-10-CM

## 2024-05-17 DIAGNOSIS — R55 Syncope and collapse: Secondary | ICD-10-CM

## 2024-05-17 NOTE — Progress Notes (Unsigned)
 EEG complete and ready for review.

## 2024-05-21 ENCOUNTER — Other Ambulatory Visit: Payer: Self-pay

## 2024-05-22 NOTE — Procedures (Signed)
 ELECTROENCEPHALOGRAM REPORT  Date of Study: 05/17/2024  Patient's Name: Jim Hall MRN: [id number] Date of Birth: [birth year]  Referring Provider: Dr. Darice Shivers  Clinical History: This is a 29 year old man with recurrent syncope. EEG for classification.  Medications: nortriptyline   Technical Summary: A multichannel digital 1-hour EEG recording measured by the international 10-20 system with electrodes applied with paste and impedances below 5000 ohms performed in our laboratory with EKG monitoring in an awake and asleep patient.  Hyperventilation and photic stimulation were performed.  The digital EEG was referentially recorded, reformatted, and digitally filtered in a variety of bipolar and referential montages for optimal display.    Description: The patient is awake and asleep during the recording.  During maximal wakefulness, there is a symmetric, medium voltage 10 Hz posterior dominant rhythm that attenuates with eye opening.  The record is symmetric.  During drowsiness and sleep, there is an increase in theta slowing of the background with rare vertex waves seen. Hyperventilation and photic stimulation did not elicit any abnormalities.  There were no epileptiform discharges or electrographic seizures seen.    EKG lead was unremarkable.  Impression: This 1-hour awake and asleep EEG is normal.    Clinical Correlation: A normal EEG does not exclude a clinical diagnosis of epilepsy.  If further clinical questions remain, prolonged EEG may be helpful.  Clinical correlation is advised.   Darice Shivers, M.D.

## 2024-05-28 ENCOUNTER — Ambulatory Visit: Payer: Self-pay | Admitting: Neurology

## 2024-05-29 ENCOUNTER — Ambulatory Visit: Admitting: Physician Assistant
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
## 2024-05-30 ENCOUNTER — Encounter: Payer: Self-pay | Admitting: Physician Assistant

## 2024-05-30 ENCOUNTER — Ambulatory Visit

## 2024-05-30 ENCOUNTER — Ambulatory Visit: Payer: Self-pay | Attending: Physician Assistant | Admitting: Physician Assistant

## 2024-05-30 ENCOUNTER — Ambulatory Visit: Payer: Self-pay | Admitting: Neurology

## 2024-05-30 VITALS — BP 115/71 | HR 57 | Ht 73.0 in | Wt 183.1 lb

## 2024-05-30 DIAGNOSIS — R55 Syncope and collapse: Secondary | ICD-10-CM

## 2024-05-30 NOTE — Patient Instructions (Addendum)
 Medication Instructions:  Your physician recommends that you continue on your current medications as directed. Please refer to the Current Medication list given to you today.  *If you need a refill on your cardiac medications before your next appointment, please call your pharmacy*  Lab Work: None ordered  If you have labs (blood work) drawn today and your tests are completely normal, you will receive your results only by: MyChart Message (if you have MyChart) OR A paper copy in the mail If you have any lab test that is abnormal or we need to change your treatment, we will call you to review the results.  Testing/Procedures: Your physician has requested that you have an exercise tolerance test. For further information please visit [email or web address] Please also follow instruction sheet, BELOW:   - DO NOT EAT, DRINK, OR USE TOBACCO PRODUCTS WITHIN 4 HOURS OF THE TEST - DRESS PREPARED TO EXERCISE IN A COMFORTABLE, 2 PIECE CLOTHING OUTFIT AND WALKING SHOES - BRING ANY PRESCRIPTION MEDICATIONS WITH YOU  - NOTIFY THE OFFICE 24 HOURS IN ADVANCE IF YOU NEED TO CANCEL - CALL THE OFFICE [phone] IF YOU HAVE ANY QUESTIONS   Your physician has requested that you have an echocardiogram. Echocardiography is a painless test that uses sound waves to create images of your heart. It provides your doctor with information about the size and shape of your heart and how well your hearts chambers and valves are working. This procedure takes approximately one hour. There are no restrictions for this procedure. Please do NOT wear cologne, perfume, aftershave, or lotions (deodorant is allowed). Please arrive 15 minutes prior to your appointment time.  Please note: We ask at that you not bring children with you during ultrasound (echo/ vascular) testing. Due to room size and safety concerns, children are not allowed in the ultrasound rooms during exams. Our front office staff cannot provide observation of  children in our lobby area while testing is being conducted. An adult accompanying a patient to their appointment will only be allowed in the ultrasound room at the discretion of the ultrasound technician under special circumstances. We apologize for any inconvenience.   ZIO AT Long term monitor-Live Telemetry  Your physician has requested you wear a ZIO patch monitor for 14 DAYS.  This is a single patch monitor. [employer] supplies one patch monitor per enrollment. Additional  stickers are not available.  Please do not apply patch if you will be having a Nuclear Stress Test, Echocardiogram, Cardiac CT, MRI,  or Chest Xray during the period you would be wearing the monitor. The patch cannot be worn during  these tests. You cannot remove and re-apply the ZIO AT patch monitor.  Your ZIO patch monitor will be mailed 3 day USPS to your address on file. It may take 3-5 days to  receive your monitor after you have been enrolled.  Once you have received your monitor, please review the enclosed instructions. Your monitor has  already been registered assigning a specific monitor serial # to you.   Billing and Patient Assistance Program information  Jim Hall has been supplied with any insurance information on record for billing. [employer] offers a sliding scale Patient Assistance Program for patients without insurance, or whose  insurance does not completely cover the cost of the ZIO patch monitor. You must apply for the  Patient Assistance Program to qualify for the discounted rate. To apply, call [employer] at [phone],  select option 4, select option 2 , ask to apply for the Patient Assistance  Program, (you can request an  interpreter if needed). [employer] will ask your household income and how many people are in your  household. [employer] will quote your out-of-pocket cost based on this information. They will also be able  to set up a 12 month interest free payment plan if needed.  Applying the monitor    Shave hair from upper left chest.  Hold the abrader disc by orange tab. Rub the abrader in 40 strokes over left upper chest as indicated in  your monitor instructions.  Clean area with 4 enclosed alcohol pads. Use all pads to ensure the area is cleaned thoroughly. Let  dry.  Apply patch as indicated in monitor instructions. Patch will be placed under collarbone on left side of  chest with arrow pointing upward.  Rub patch adhesive wings for 2 minutes. Remove the white label marked 1. Remove the white label  marked 2. Rub patch adhesive wings for 2 additional minutes.  While looking in a mirror, press and release button in center of patch. A small green light will flash 3-4  times. This will be your only indicator that the monitor has been turned on.  Do not shower for the first 24 hours. You may shower after the first 24 hours.  Press the button if you feel a symptom. You will hear a small click. Record Date, Time and Symptom in  the Patient Log.   Starting the Gateway  In your kit there is a audiological scientist box the size of a cellphone. This is [employer]. It transmits all your  recorded data to [facility]. This box must always stay within 10 feet of you. Open the box and push the *  button. There will be a light that blinks orange and then green a few times. When the light stops  blinking, the Gateway is connected to the ZIO patch. Call [employer] at [phone] to confirm your monitor is transmitting.  Returning your monitor  Remove your patch and place it inside the Gateway. In the lower half of the Gateway there is a white  bag with prepaid postage on it. Place Gateway in bag and seal. Mail package back to [place] as soon as  possible. Your physician should have your final report approximately 7 days after you have mailed back  your monitor. Call [facility] Customer Care at [phone] if you have questions regarding your ZIO AT  patch monitor. Call them immediately  if you see an orange light blinking on your monitor.  If your monitor falls off in less than 4 days, contact our Monitor department at [phone]. If your  monitor becomes loose or falls off after 4 days call [employer] at [phone] for suggestions on  securing your monitor  Follow-Up: At [facility], you and your health needs are our priority.  As part of our continuing mission to provide you with exceptional heart care, our providers are all part of one team.  This team includes your primary Cardiologist (physician) and Advanced Practice Providers or APPs (Physician Assistants and Nurse Practitioners) who all work together to provide you with the care you need, when you need it.  Your next appointment:   3 month(s)  Provider:   Lonni LITTIE Nanas, MD    We recommend signing up for the patient portal called MyChart.  Sign up information is provided on this After Visit Summary.  MyChart is used to connect with patients for Virtual Visits (Telemedicine).  Patients are able to view lab/test  results, encounter notes, upcoming appointments, etc.  Non-urgent messages can be sent to your provider as well.   To learn more about what you can do with MyChart, go to [email or web address].   Other Instructions NO DRIVING FOR 6 MONTHS

## 2024-05-30 NOTE — Assessment & Plan Note (Addendum)
 He is evaluated for recurrent syncopal episodes since September, increasing in frequency.  He has also noted several episodes of near syncope.  His syncopal episodes are often associated with severe left-sided headache, lightheadedness, double vision, palpitations, and feeling hot.  There is no clear trigger identified. No associated chest pain or shortness of breath. Previous EKGs have not demonstrated preexcitation, Brugada pattern or prolonged QT.  Recent EEG normal.  Brain MRI is pending.  There is no family history of heart attack or sudden cardiac death.  His orthostatic vital signs today do not demonstrate any significant blood pressure drop.  Paradoxically, his blood pressure increases with standing.  He does have an elevated heart rate with standing.  I did review his case today with Dr. Kate (attending MD).  Overall, his symptoms are likely vasovagal.  I cannot rule out orthostatic hypERtension/hyperadrenergic POTS.  However, he does not have positional symptoms.  He has had symptoms sitting as well as standing.  It is concerning that he has exertional symptoms.  Will proceed with echocardiogram to rule out structural heart disease as well as an exercise tolerance test to rule out exercise-induced arrhythmias and assess blood pressure response to exercise.  He did have a monitor several months ago.  Will repeat cardiac monitor to rule out significant arrhythmias as well. - 2D echocardiogram - Zio patch AT monitor x 2 weeks - Exercise tolerance test - Patient advised to not drive for 6 months after last episode of syncope. - Follow-up 2-3 months. - Consider 24 hour urine for catecholamines/metanephrines depending upon symptoms, test results - Follow up with neurology as planned.  - DOD: Kate - Will follow up with him if needed.

## 2024-05-30 NOTE — Progress Notes (Unsigned)
 Enrolled patient for 14 day Zio AT monitor to be mailed to patients home   Jim Hall to read

## 2024-06-03 ENCOUNTER — Encounter (HOSPITAL_COMMUNITY): Payer: Self-pay

## 2024-06-04 ENCOUNTER — Ambulatory Visit (HOSPITAL_COMMUNITY): Payer: Self-pay

## 2024-06-06 NOTE — Telephone Encounter (Signed)
-----   Message from Darice Shivers, MD sent at 05/28/2024  3:16 PM EST ----- Pls let him know the EEG was normal. Proceed with brain MRI as scheduled, thanks

## 2024-06-06 NOTE — Telephone Encounter (Signed)
 Pt called an informed EEG was normal. Proceed with brain MRI as scheduled

## 2024-06-14 ENCOUNTER — Ambulatory Visit
Admission: RE | Admit: 2024-06-14 | Discharge: 2024-06-14 | Disposition: A | Payer: Self-pay | Source: Ambulatory Visit | Attending: Neurology

## 2024-06-14 DIAGNOSIS — R519 Headache, unspecified: Secondary | ICD-10-CM

## 2024-06-14 DIAGNOSIS — R55 Syncope and collapse: Secondary | ICD-10-CM

## 2024-06-14 MED ORDER — GADOPICLENOL 0.5 MMOL/ML IV SOLN
8.5000 mL | Freq: Once | INTRAVENOUS | Status: AC | PRN
  Administered 2024-06-14: 8.5 mL via INTRAVENOUS

## 2024-06-26 ENCOUNTER — Encounter (HOSPITAL_COMMUNITY): Payer: Self-pay

## 2024-07-04 ENCOUNTER — Ambulatory Visit (HOSPITAL_COMMUNITY): Payer: Self-pay

## 2024-08-28 ENCOUNTER — Ambulatory Visit: Admitting: Physician Assistant

## 2024-08-29 ENCOUNTER — Ambulatory Visit: Payer: Self-pay | Admitting: Neurology
